# Patient Record
Sex: Male | Born: 1965
Health system: Southern US, Community
[De-identification: ages and names within clinical notes are randomized; demographics above are authoritative.]

## PROBLEM LIST (undated history)

## (undated) DIAGNOSIS — M1990 Unspecified osteoarthritis, unspecified site: Secondary | ICD-10-CM

## (undated) DIAGNOSIS — K219 Gastro-esophageal reflux disease without esophagitis: Secondary | ICD-10-CM

## (undated) HISTORY — PX: APPENDECTOMY: SHX54

## (undated) HISTORY — DX: Gastro-esophageal reflux disease without esophagitis: K21.9

## (undated) HISTORY — DX: Unspecified osteoarthritis, unspecified site: M19.90

---

## 2006-02-27 ENCOUNTER — Emergency Department (HOSPITAL_COMMUNITY): Admission: EM | Admit: 2006-02-27 | Discharge: 2006-02-27 | Payer: Self-pay | Admitting: Emergency Medicine

## 2006-10-02 ENCOUNTER — Emergency Department (HOSPITAL_COMMUNITY): Admission: EM | Admit: 2006-10-02 | Discharge: 2006-10-02 | Payer: Self-pay | Admitting: Family Medicine

## 2006-10-30 ENCOUNTER — Emergency Department (HOSPITAL_COMMUNITY): Admission: EM | Admit: 2006-10-30 | Discharge: 2006-10-30 | Payer: Self-pay | Admitting: Family Medicine

## 2007-03-22 ENCOUNTER — Emergency Department (HOSPITAL_COMMUNITY): Admission: EM | Admit: 2007-03-22 | Discharge: 2007-03-22 | Payer: Self-pay | Admitting: Emergency Medicine

## 2007-04-04 ENCOUNTER — Emergency Department (HOSPITAL_COMMUNITY): Admission: EM | Admit: 2007-04-04 | Discharge: 2007-04-04 | Payer: Self-pay | Admitting: Family Medicine

## 2007-04-06 ENCOUNTER — Emergency Department (HOSPITAL_COMMUNITY): Admission: EM | Admit: 2007-04-06 | Discharge: 2007-04-06 | Payer: Self-pay | Admitting: Emergency Medicine

## 2007-11-01 ENCOUNTER — Emergency Department (HOSPITAL_COMMUNITY): Admission: EM | Admit: 2007-11-01 | Discharge: 2007-11-01 | Payer: Self-pay | Admitting: Family Medicine

## 2007-11-03 ENCOUNTER — Emergency Department (HOSPITAL_COMMUNITY): Admission: EM | Admit: 2007-11-03 | Discharge: 2007-11-03 | Payer: Self-pay | Admitting: Family Medicine

## 2007-11-04 ENCOUNTER — Emergency Department (HOSPITAL_COMMUNITY): Admission: EM | Admit: 2007-11-04 | Discharge: 2007-11-04 | Payer: Self-pay | Admitting: Emergency Medicine

## 2008-05-13 ENCOUNTER — Emergency Department (HOSPITAL_COMMUNITY): Admission: EM | Admit: 2008-05-13 | Discharge: 2008-05-13 | Payer: Self-pay | Admitting: Emergency Medicine

## 2008-11-22 ENCOUNTER — Emergency Department (HOSPITAL_COMMUNITY): Admission: EM | Admit: 2008-11-22 | Discharge: 2008-11-22 | Payer: Self-pay | Admitting: Family Medicine

## 2009-05-05 ENCOUNTER — Emergency Department (HOSPITAL_COMMUNITY): Admission: EM | Admit: 2009-05-05 | Discharge: 2009-05-05 | Payer: Self-pay | Admitting: Emergency Medicine

## 2009-05-07 ENCOUNTER — Emergency Department (HOSPITAL_COMMUNITY): Admission: EM | Admit: 2009-05-07 | Discharge: 2009-05-07 | Payer: Self-pay | Admitting: Emergency Medicine

## 2009-07-21 ENCOUNTER — Emergency Department (HOSPITAL_COMMUNITY): Admission: EM | Admit: 2009-07-21 | Discharge: 2009-07-21 | Payer: Self-pay | Admitting: Emergency Medicine

## 2009-07-25 ENCOUNTER — Emergency Department (HOSPITAL_COMMUNITY): Admission: EM | Admit: 2009-07-25 | Discharge: 2009-07-25 | Payer: Self-pay | Admitting: Emergency Medicine

## 2009-10-31 ENCOUNTER — Ambulatory Visit: Payer: Self-pay | Admitting: Family Medicine

## 2009-10-31 DIAGNOSIS — K219 Gastro-esophageal reflux disease without esophagitis: Secondary | ICD-10-CM | POA: Insufficient documentation

## 2009-10-31 DIAGNOSIS — F17200 Nicotine dependence, unspecified, uncomplicated: Secondary | ICD-10-CM | POA: Insufficient documentation

## 2009-10-31 DIAGNOSIS — F329 Major depressive disorder, single episode, unspecified: Secondary | ICD-10-CM | POA: Insufficient documentation

## 2009-10-31 DIAGNOSIS — J309 Allergic rhinitis, unspecified: Secondary | ICD-10-CM | POA: Insufficient documentation

## 2009-10-31 LAB — CONVERTED CEMR LAB
Bilirubin Urine: NEGATIVE
Blood Glucose, Fingerstick: 126
Blood in Urine, dipstick: NEGATIVE
Glucose, Urine, Semiquant: NEGATIVE
Hgb A1c MFr Bld: 6 %
Ketones, urine, test strip: NEGATIVE
Nitrite: NEGATIVE
Protein, U semiquant: NEGATIVE
Specific Gravity, Urine: 1.02
Urobilinogen, UA: 0.2
WBC Urine, dipstick: NEGATIVE
pH: 6

## 2009-11-17 ENCOUNTER — Ambulatory Visit: Payer: Self-pay | Admitting: Family Medicine

## 2009-11-17 LAB — CONVERTED CEMR LAB
ALT: 114 units/L — ABNORMAL HIGH (ref 0–53)
AST: 62 units/L — ABNORMAL HIGH (ref 0–37)
Albumin: 4.4 g/dL (ref 3.5–5.2)
Alkaline Phosphatase: 68 units/L (ref 39–117)
BUN: 16 mg/dL (ref 6–23)
Basophils Absolute: 0.1 10*3/uL (ref 0.0–0.1)
Basophils Relative: 1 % (ref 0–1)
CO2: 24 meq/L (ref 19–32)
Calcium: 8.9 mg/dL (ref 8.4–10.5)
Chloride: 102 meq/L (ref 96–112)
Cholesterol: 158 mg/dL (ref 0–200)
Creatinine, Ser: 0.92 mg/dL (ref 0.40–1.50)
Eosinophils Absolute: 0.2 10*3/uL (ref 0.0–0.7)
Eosinophils Relative: 3 % (ref 0–5)
Glucose, Bld: 75 mg/dL (ref 70–99)
HCT: 47.2 % (ref 39.0–52.0)
HDL: 35 mg/dL — ABNORMAL LOW (ref >39)
Hemoglobin: 15.7 g/dL (ref 13.0–17.0)
LDL Cholesterol: 95 mg/dL (ref 0–99)
Lymphocytes Relative: 43 % (ref 12–46)
Lymphs Abs: 2.7 10*3/uL (ref 0.7–4.0)
MCHC: 33.3 g/dL (ref 30.0–36.0)
MCV: 90.2 fL (ref 78.0–100.0)
Monocytes Absolute: 0.6 10*3/uL (ref 0.1–1.0)
Monocytes Relative: 9 % (ref 3–12)
Neutro Abs: 2.8 10*3/uL (ref 1.7–7.7)
Neutrophils Relative %: 44 % (ref 43–77)
Platelets: 233 10*3/uL (ref 150–400)
Potassium: 4.6 meq/L (ref 3.5–5.3)
RBC: 5.23 M/uL (ref 4.22–5.81)
RDW: 14.7 % (ref 11.5–15.5)
Rapid HIV Screen: NEGATIVE
Sodium: 139 meq/L (ref 135–145)
Total Bilirubin: 0.5 mg/dL (ref 0.3–1.2)
Total CHOL/HDL Ratio: 4.5
Total Protein: 7.5 g/dL (ref 6.0–8.3)
Triglycerides: 138 mg/dL (ref <150)
VLDL: 28 mg/dL (ref 0–40)
Vit D, 25-Hydroxy: 13 ng/mL — ABNORMAL LOW (ref 30–89)
WBC: 6.4 10*3/uL (ref 4.0–10.5)

## 2009-11-22 ENCOUNTER — Telehealth (INDEPENDENT_AMBULATORY_CARE_PROVIDER_SITE_OTHER): Payer: Self-pay | Admitting: Family Medicine

## 2009-11-29 ENCOUNTER — Ambulatory Visit: Payer: Self-pay | Admitting: Family Medicine

## 2009-11-29 DIAGNOSIS — K769 Liver disease, unspecified: Secondary | ICD-10-CM | POA: Insufficient documentation

## 2009-11-29 LAB — CONVERTED CEMR LAB
ALT: 30 units/L (ref 0–53)
AST: 17 units/L (ref 0–37)
Albumin: 4.2 g/dL (ref 3.5–5.2)
Alkaline Phosphatase: 62 units/L (ref 39–117)
BUN: 11 mg/dL (ref 6–23)
CO2: 23 meq/L (ref 19–32)
Calcium: 9.2 mg/dL (ref 8.4–10.5)
Chloride: 104 meq/L (ref 96–112)
Creatinine, Ser: 0.76 mg/dL (ref 0.40–1.50)
Glucose, Bld: 81 mg/dL (ref 70–99)
HCT: 46.1 % (ref 39.0–52.0)
HCV Ab: NEGATIVE
Hemoglobin: 15.3 g/dL (ref 13.0–17.0)
Hep A IgM: NEGATIVE
Hep B C IgM: NEGATIVE
Hepatitis B Surface Ag: NEGATIVE
MCHC: 33.2 g/dL (ref 30.0–36.0)
MCV: 90.4 fL (ref 78.0–100.0)
Platelets: 219 10*3/uL (ref 150–400)
Potassium: 4.7 meq/L (ref 3.5–5.3)
RBC: 5.1 M/uL (ref 4.22–5.81)
RDW: 14.1 % (ref 11.5–15.5)
Sodium: 139 meq/L (ref 135–145)
Total Bilirubin: 0.5 mg/dL (ref 0.3–1.2)
Total Protein: 7.4 g/dL (ref 6.0–8.3)
WBC: 6.4 10*3/uL (ref 4.0–10.5)

## 2009-12-01 ENCOUNTER — Encounter (INDEPENDENT_AMBULATORY_CARE_PROVIDER_SITE_OTHER): Payer: Self-pay | Admitting: Family Medicine

## 2009-12-05 ENCOUNTER — Telehealth (INDEPENDENT_AMBULATORY_CARE_PROVIDER_SITE_OTHER): Payer: Self-pay | Admitting: Family Medicine

## 2009-12-05 ENCOUNTER — Encounter (INDEPENDENT_AMBULATORY_CARE_PROVIDER_SITE_OTHER): Payer: Self-pay | Admitting: Family Medicine

## 2009-12-13 ENCOUNTER — Encounter (INDEPENDENT_AMBULATORY_CARE_PROVIDER_SITE_OTHER): Payer: Self-pay | Admitting: Family Medicine

## 2009-12-13 ENCOUNTER — Ambulatory Visit: Payer: Self-pay | Admitting: Physician Assistant

## 2009-12-13 DIAGNOSIS — E559 Vitamin D deficiency, unspecified: Secondary | ICD-10-CM | POA: Insufficient documentation

## 2009-12-13 DIAGNOSIS — E739 Lactose intolerance, unspecified: Secondary | ICD-10-CM | POA: Insufficient documentation

## 2009-12-13 DIAGNOSIS — K029 Dental caries, unspecified: Secondary | ICD-10-CM | POA: Insufficient documentation

## 2009-12-13 DIAGNOSIS — M549 Dorsalgia, unspecified: Secondary | ICD-10-CM | POA: Insufficient documentation

## 2009-12-13 LAB — CONVERTED CEMR LAB
ALT: 39 units/L (ref 0–53)
AST: 19 units/L (ref 0–37)
Albumin: 4.4 g/dL (ref 3.5–5.2)
Alkaline Phosphatase: 68 units/L (ref 39–117)
BUN: 18 mg/dL (ref 6–23)
CO2: 23 meq/L (ref 19–32)
Calcium: 8.9 mg/dL (ref 8.4–10.5)
Chloride: 104 meq/L (ref 96–112)
Creatinine, Ser: 0.76 mg/dL (ref 0.40–1.50)
Glucose, Bld: 88 mg/dL (ref 70–99)
Potassium: 4.6 meq/L (ref 3.5–5.3)
Sodium: 138 meq/L (ref 135–145)
Total Bilirubin: 0.4 mg/dL (ref 0.3–1.2)
Total Protein: 7.2 g/dL (ref 6.0–8.3)

## 2009-12-16 ENCOUNTER — Ambulatory Visit (HOSPITAL_COMMUNITY): Admission: RE | Admit: 2009-12-16 | Discharge: 2009-12-16 | Payer: Self-pay | Admitting: Internal Medicine

## 2009-12-16 ENCOUNTER — Encounter: Payer: Self-pay | Admitting: Physician Assistant

## 2010-01-18 ENCOUNTER — Ambulatory Visit: Payer: Self-pay | Admitting: Physician Assistant

## 2010-06-22 ENCOUNTER — Encounter: Payer: Self-pay | Admitting: Physician Assistant

## 2010-08-09 ENCOUNTER — Ambulatory Visit: Payer: Self-pay | Admitting: Physician Assistant

## 2010-08-09 DIAGNOSIS — M25519 Pain in unspecified shoulder: Secondary | ICD-10-CM | POA: Insufficient documentation

## 2010-08-09 LAB — CONVERTED CEMR LAB: Blood Glucose, Fingerstick: 93

## 2010-08-10 LAB — CONVERTED CEMR LAB
Hgb A1c MFr Bld: 5.8 % — ABNORMAL HIGH (ref <5.7)
PSA: 0.21 ng/mL (ref 0.10–4.00)
Vit D, 25-Hydroxy: 14 ng/mL — ABNORMAL LOW (ref 30–89)

## 2010-10-19 ENCOUNTER — Emergency Department (HOSPITAL_COMMUNITY)
Admission: EM | Admit: 2010-10-19 | Discharge: 2010-10-19 | Payer: Self-pay | Source: Home / Self Care | Admitting: Emergency Medicine

## 2010-11-17 ENCOUNTER — Ambulatory Visit: Payer: Self-pay | Admitting: Nurse Practitioner

## 2010-12-31 ENCOUNTER — Encounter: Payer: Self-pay | Admitting: Family Medicine

## 2011-01-01 ENCOUNTER — Telehealth (INDEPENDENT_AMBULATORY_CARE_PROVIDER_SITE_OTHER): Payer: Self-pay | Admitting: Nurse Practitioner

## 2011-01-09 NOTE — Letter (Signed)
Summary: PT INFORMATION SHEET  PT INFORMATION SHEET   Imported By: Arta Bruce 12/21/2009 15:16:45  _____________________________________________________________________  External Attachment:    Type:   Image     Comment:   External Document

## 2011-01-09 NOTE — Progress Notes (Signed)
Summary: Office Visit//DEPRESSION SCREENING  Office Visit//DEPRESSION SCREENING   Imported By: Arta Bruce 08/11/2010 11:01:21  _____________________________________________________________________  External Attachment:    Type:   Image     Comment:   External Document
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
## 2011-01-09 NOTE — Letter (Signed)
Summary: DENTAL REFERRAL  DENTAL REFERRAL   Imported By: Arta Bruce 06/22/2010 09:54:17  _____________________________________________________________________  External Attachment:    Type:   Image     Comment:   External Document

## 2011-01-09 NOTE — Letter (Signed)
Summary: *HSN Results Follow up  [facility]  [address] [place], [place] [address]   Phone: [phone]  Fax: [phone]      12/16/2009   Preston Sullivan [address] [place], [place]  [address]   Dear  Mr. [name],                            ____S.Drinkard,FNP   ____D. Gore,FNP       ____B. McPherson,MD   ____V. Rankins,MD    ____E. Mulberry,MD    ____N. Daphine Deutscher, FNP  ____D. Reche Dixon, MD    ____K. Philipp Deputy, MD    __x__S. Alben Spittle, PA-C     This letter is to inform you that your recent test(s):  _______Pap Smear    ___x____Lab Test     ___x____X-ray    ___x____ is within acceptable limits  _______ requires a medication change  _______ requires a follow-up lab visit  _______ requires a follow-up visit with your provider   Comments: Ultrasound and labs were normal.  I am going to send you to physical therapy for your back.       _________________________________________________________ If you have any questions, please contact our office                     Sincerely,  Tereso Newcomer PA-C [facility]
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
## 2011-01-09 NOTE — Letter (Signed)
Summary: *HSN Results Follow up  [facility]  [address] [place], [place] [address]   Phone: [phone]  Fax: [phone]      12/01/2009   Preston Sullivan [address] [place], [place]  [address]   Dear  Mr. Preston Sullivan,                            ____S.Drinkard,FNP   ____D. Gore,FNP       ____B. McPherson,MD   ____V. Rankins,MD    ____E. Mulberry,MD    ____N. Daphine Deutscher, FNP  ____D. Reche Dixon, MD    ____K. Philipp Deputy, MD    ____Other     This letter is to inform you that your recent test(s):  _______Pap Smear    ____X___Lab Test     _______X-ray    ___X____ is within acceptable limits  _______ requires a medication change  _______ requires a follow-up lab visit  _______ requires a follow-up visit with your provider   Comments:        _________________________________________________________ If you have any questions, please contact our office                     Sincerely,  Syliva Overman MD [facility]

## 2011-01-09 NOTE — Assessment & Plan Note (Signed)
Summary: FU PER Makayla Confer//LABS/////KT   Vital Signs:  Patient profile:   45 year old male Height:      66.5 inches Weight:      193 pounds BMI:     30.80 Temp:     98.8 degrees F oral Pulse rate:   71 / minute Pulse rhythm:   regular Resp:     98.8 per minute BP sitting:   110 / 72  (left arm) Cuff size:   large  Vitals Entered By: [place] Shannon (November 29, 2009 9:08 AM) CC: f/u.... pt says he side is still hurting.... pt says that his left hand is real dry..... Is Patient Diabetic? No Pain Assessment Patient in pain? no       Does patient need assistance? Functional Status Self care Ambulation Normal   Visit Type:  F/u visit Primary Provider:  Carolyne Fiscal  CC:  f/u.... pt says he side is still hurting.... pt says that his left hand is real dry.....Marland Kitchen  History of Present Illness: Elevated liver enzymes, no known h/o hepatitis or known contact with hepatitis, also experiencing right sided pain that is somewhat constant and crampy in nature. Has been present months, did not have any prior episodes. >10 sexual partners in the past although he is currently monogamous with his wife, no known h/o STDs, no IV drug use or blood transfusions or old or "dirty" tattoos.  Smoking cessation. Down to 1/2 ppd from 1 ppd. Still trying to cut back, he states that he still needs to smoke because of all the stress that he is feeling with job situation.  Reflux. He did not fill his omeprazole, still experiencing occasional episodes of reflux although lessened in severity from his last visit. He stated that lines were too long at the [employer], I explained that he could get the medication at health department or OTC.  Depression. Unchanged, continues to feel down about his situation, but no SI/HI, psychotic features or stong impediment to his life. Discussed exercise again and again states no meds as preference.  Allergic rhinitis, feels like symptoms a little more prominent since flu  shot, did not fill medications, see above in reflux.  Reviewed lab results and discussed with the patient.  Current Medications (verified): 1)  Omeprazole 20 Mg Cpdr (Omeprazole) .Marland Kitchen.. 1 By Mouth Daily 2)  Flonase 50 Mcg/act Susp (Fluticasone Propionate) .... 2 Puffs Q Nostril Daily  Allergies (verified): No Known Drug Allergies PMH-FH-SH reviewed for relevance  Review of Systems General:  Complains of fatigue, malaise, and weakness; denies chills, fever, sweats, and weight loss. CV:  Denies chest pain or discomfort, fainting, near fainting, and palpitations. Resp:  Denies cough, coughing up blood, shortness of breath, and wheezing. GI:  Complains of abdominal pain and indigestion; denies bloody stools, dark tarry stools, diarrhea, loss of appetite, nausea, vomiting, vomiting blood, and yellowish skin color. GU:  Denies dysuria and hematuria.  Physical Exam  General:  Well-developed,well-nourished,in no acute distress; alert,appropriate and cooperative throughout examination Head:  Normocephalic and atraumatic without obvious abnormalities. No apparent alopecia or balding. Eyes:  No corneal or conjunctival inflammation noted. EOMI. Perrla. Funduscopic exam benign, without hemorrhages, exudates or papilledema. Vision decreased in L eye. Ears:  External ear exam shows no significant lesions or deformities.  Otoscopic examination reveals clear canals, tympanic membranes are intact bilaterally without bulging, retraction, inflammation or discharge. Hearing is grossly normal bilaterally. Nose:  External nasal examination shows no deformity or inflammation. Nasal mucosa are pink and moist without  lesions or exudates. Mouth:  Oral mucosa and oropharynx without lesions or exudates.  Teeth in good repair. Neck:  No deformities, masses, or tenderness noted. Lungs:  Normal respiratory effort, chest expands symmetrically. Lungs are clear to auscultation, no crackles or wheezes. Heart:  Normal rate  and regular rhythm. S1 and S2 normal without gallop, murmur, click, rub or other extra sounds. Abdomen:  Bowel sounds positive,abdomen soft and non-tender without masses,  hernias noted. Organomegaly not definite, but + murphy's sign RUQ and liver palpable on inspiration. Cervical Nodes:  No lymphadenopathy noted Psych:  Cognition and judgment appear intact. Alert and cooperative with normal attention span and concentration. No apparent delusions, illusions, hallucinations   Impression & Recommendations:  Problem # 1:  LIVER PAIN (ICD-573.9) Assessment New Elevated ALT/AST, no heavy ETOH use, no known hepatitis  Orders: Radiology Referral (Radiology)- hepatic US CMP CBC Hepatitis panel  Problem # 2:  PREVENTIVE HEALTH CARE (ICD-V70.0) Flu vaccine 10/2009 - "gave" him flu-like symptoms of malaise, fatigue, muscle pain, increased rhinorrhea. NEED to discuss pneumovax- Tobacco use Tetanus 2007 NEEDS HIV, RPR Vitamin D 13 -10/2009 LDL 95, HDL 35 -10/19/2009  Orders: T-Comprehensive Metabolic Panel ([id number]) T-Hepatitis Profile Acute (09811-91478) T-CBC w/Diff ([id number])  Problem # 3:  TOBACCO USER (ICD-305.1) Assessment: Improved Discussed cessation. He and wife have improved from 1ppd to 1/2 ppd. Currently continuing to cut back in anticipation of setting quit date. Reasons for use include decreases stress. We discussed nicotine withdrawal symptoms.  Orders: T-Comprehensive Metabolic Panel [phone]) T-Hepatitis Profile Acute (28413-24401) T-CBC w/Diff ([id number])  Problem # 4:  GERD (ICD-530.81) Assessment: Unchanged Did not fill omeprazole, will fill.  His updated medication list for this problem includes:    Omeprazole 20 Mg Cpdr (Omeprazole) .Marland Kitchen... 1 by mouth daily  Orders: T-Comprehensive Metabolic Panel [phone]) T-Hepatitis Profile Acute ([id number]) T-CBC w/Diff ([id number])  Problem # 5:  ALLERGIC RHINITIS (ICD-477.9) Assessment:  Unchanged Did not fill, will fill.  His updated medication list for this problem includes:    Flonase 50 Mcg/act Susp (Fluticasone propionate) .Marland Kitchen... 2 puffs q nostril daily  Orders: T-Comprehensive Metabolic Panel [phone]) T-Hepatitis Profile Acute (35573-22025) T-CBC w/Diff ([id number])  Problem # 6:  DEPRESSION (ICD-311) Assessment: Unchanged Feelings of being down, currently not on an exercise or diet plan.  No SI/HI or major impedence, perfers no medications. He is to begin exercise regimen and consider psychology in future as needed.  Orders: T-Comprehensive Metabolic Panel [id number]) T-Hepatitis Profile Acute ([id number]) T-CBC w/Diff ([id number])  Complete Medication List: 1)  Omeprazole 20 Mg Cpdr (Omeprazole) .Marland Kitchen.. 1 by mouth daily 2)  Flonase 50 Mcg/act Susp (Fluticasone propionate) .... 2 puffs q nostril daily 3)  Ergocalciferol 50000 Unit Caps (Ergocalciferol) .Marland Kitchen.. 1 by mouth weekly x 12 weeks  Patient Instructions: 1)  Please schedule a follow-up appointment in 2 weeks with provider here at [facility]. 2)  Tobacco is very bad for your health and your loved ones! You Should stop smoking!. 3)  Stop Smoking Tips: Choose a Quit date. Cut down before the Quit date. decide what you will do as a substitute when you feel the urge to smoke(gum,toothpick,exercise). 4)  It is important that you exercise regularly at least 20 minutes 5 times a week. If you develop chest pain, have severe difficulty breathing, or feel very tired , stop exercising immediately and seek medical attention. Prescriptions: ERGOCALCIFEROL 50000 UNIT CAPS (ERGOCALCIFEROL) 1 by mouth weekly x 12 weeks  #12 x 0   Entered and Authorized by:   Clayburn Pert  Carolyne Fiscal MD   Signed by:   Syliva Overman MD on 11/29/2009   Method used:   Print then Give to Patient   RxID:   [id number]

## 2011-01-09 NOTE — Assessment & Plan Note (Signed)
Summary: 2 MONTH FU///KT   Vital Signs:  Patient profile:   45 year old male Weight:      197.3 pounds Temp:     98.1 degrees F Pulse rate:   69 / minute Pulse rhythm:       regular Resp:     16 per minute BP sitting:   98 / 57  (right arm) Cuff size:   large  Vitals Entered By: Geanie Cooley  (January 18, 2010 11:44 AM) CC: PT here for 84month followup. Pt would like to know the results of his recent ultrasound. PT states he has occasional back pain. FYI pt hasnt heard anything on his physical therapy, refaxing referral., Abdominal Pain Is Patient Diabetic? No Pain Assessment Patient in pain? no       Does patient need assistance? Functional Status Self care Ambulation Normal   Primary Care Provider:  Tereso Newcomer, PA-C  CC:  PT here for 84month followup. Pt would like to know the results of his recent ultrasound. PT states he has occasional back pain. FYI pt hasnt heard anything on his physical therapy, refaxing referral., and Abdominal Pain.  History of Present Illness: Here for f/u. Never got appt for PT. Never got meds for back.  Pharmacy thought his card was expired.  Has tried heat with relief.  No radicular symptoms.  Only feels pain with certain movements.  No change in his symptoms since his last visit. Has infrequent GERD symptoms.  Takes OTC zantac with relief.  Only notes with certain types of foods.  Dyspepsia History:      He has no alarm features of dyspepsia including no history of melena, hematochezia, dysphagia, persistent vomiting, or involuntary weight loss > 5%.  There is a prior history of GERD.  An H-2 blocker medication is currently being taken.     Allergies (verified): No Known Drug Allergies  Physical Exam  General:  alert, well-developed, and well-nourished.   Head:  normocephalic and atraumatic.   Lungs:  normal breath sounds, no crackles, and no wheezes.   Heart:  normal rate and regular rhythm.   Abdomen:  soft, non-tender, normal  bowel sounds, and no hepatomegaly.   Msk:  no spinal tend with palp neg SLR bilat some muscle tightness noted in paraspinal muscles on right lumbar are  Neurologic:  alert & oriented X3 and cranial nerves II-XII intact.     Impression & Recommendations:  Problem # 1:  BACK PAIN (ICD-724.5) never got referral to PT also never got meds from pharm called [facility] to inquire about his meds . . . he can pick up tomorrow  His updated medication list for this problem includes:    Naproxen 500 Mg Tabs (Naproxen) .Marland Kitchen... Take 1 tablet by mouth two times a day with food x 3 days, then take two times a day with food as needed for pain    Robaxin 500 Mg Tabs (Methocarbamol) .Marland Kitchen... 1 by mouth every 6 hours as needed for back spasm  Problem # 2:  GERD (ICD-530.81) actually quite mild and controlled with OTC meds will change to pepcid and he can take as needed  His updated medication list for this problem includes:    Pepcid 20 Mg Tabs (Famotidine) .Marland Kitchen... Take 1 tablet by mouth two times a day as needed for indigestion  Problem # 3:  PREVENTIVE HEALTH CARE (ICD-V70.0) schedule cpe  Complete Medication List: 1)  Pepcid 20 Mg Tabs (Famotidine) .... Take 1 tablet by mouth  two times a day as needed for indigestion 2)  Flonase 50 Mcg/act Susp (Fluticasone propionate) .... 2 puffs q nostril daily 3)  Ergocalciferol 50000 Unit Caps (Ergocalciferol) .Marland Kitchen.. 1 by mouth weekly x 12 weeks 4)  Naproxen 500 Mg Tabs (Naproxen) .... Take 1 tablet by mouth two times a day with food x 3 days, then take two times a day with food as needed for pain 5)  Robaxin 500 Mg Tabs (Methocarbamol) .Marland Kitchen.. 1 by mouth every 6 hours as needed for back spasm  Patient Instructions: 1)  Please schedule a follow-up appointment in 4 months with Scott for CPE.  Prescriptions: PEPCID 20 MG TABS (FAMOTIDINE) Take 1 tablet by mouth two times a day as needed for indigestion  #60 x 3   Entered and Authorized by:   Tereso Newcomer PA-C   Signed  by:   Tereso Newcomer PA-C on 01/18/2010   Method used:   Faxed to ...       [facility] (retail)       [address] [place], [place]  [address]       Ph: [phone]       Fax: [phone]   RxID:   [id number]    Vital Signs:  Patient profile:   45 year old male Weight:      197.3 pounds Temp:     98.1 degrees F Pulse rate:   69 / minute Pulse rhythm:       regular Resp:     16 per minute BP sitting:   98 / 57  (right arm) Cuff size:   large  Vitals Entered By: Geanie Cooley  (January 18, 2010 11:44 AM)
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
## 2011-01-09 NOTE — Progress Notes (Signed)
Summary: NO SHOW  Phone Note From Other Clinic   Summary of Call: received call from (radiology)Preston Sullivan.  Pt no showed on 12/23 for U/S abdomen. Initial call taken by: Preston Sullivan,  December 05, 2009 2:24 PM  Follow-up for Phone Call        Letter sent to patient discussing this requesting him to call our office to let us know what the problem was. If patient agrees, please reschedule as pt with unexplained liver pain and possible mild hepatomegaly.  Follow-up by: Preston Overman MD,  December 05, 2009 5:22 PM  Additional Follow-up for Phone Call Additional follow up Details #1::        Preston Sullivan CALLED AND SAYS THAT HE GOT THE LETTER ABOUT MISSING HIS APPT. HE SAYS THAT HE DIDN'T KNOW THAT HE HAD THIS APPT. AND WAS TOLD THAT SOMEONE HERE WAS GOING TO LET HIM KNOW WHEN THE APPT WAS MADE, PLUS HIS PHONE WAS OFF AND IT WAS JUST TURNED BACK ON. HE WOULD LIKE FOR [place] TO RESCHEDULE IT AND LET HIM KNOW. Additional Follow-up by: Preston Sullivan,  December 08, 2009 3:32 PM    Additional Follow-up for Phone Call Additional follow up Details #2::    will print out referral again and have Preston Sullivan complete and contact pt with appt. phone note completed... Follow-up by: Preston Sullivan CMA,  December 08, 2009 3:52 PM

## 2011-01-09 NOTE — Progress Notes (Signed)
Summary: Office Visit/DEPRESSION SCREENING  Office Visit/DEPRESSION SCREENING   Imported By: Arta Bruce 12/21/2009 15:02:30  _____________________________________________________________________  External Attachment:    Type:   Image     Comment:   External Document

## 2011-01-09 NOTE — Letter (Signed)
Summary: DENTAL REFERRAL  DENTAL REFERRAL   Imported By: Arta Bruce 01/27/2010 12:31:17  _____________________________________________________________________  External Attachment:    Type:   Image     Comment:   External Document

## 2011-01-09 NOTE — Letter (Signed)
Summary: Generic Letter  [facility]  [address] [place], [place] [address]   Phone: [phone]  Fax: [phone]    12/05/2009  QUILLAN WHITTER [address] [place], [place]  [address]  Dear Mr. Montez Morita,  Mr Hartland, the Radiology Department has recently informed us that you did not show up to get your ultrasound completed. Please let us know what the issues are regarding this so that we can make appropriate future arrangements.  Sincerely,   Syliva Overman MD

## 2011-01-09 NOTE — Letter (Signed)
Summary: SLIDING SCALE FEE  SLIDING SCALE FEE   Imported By: Arta Bruce 12/21/2009 15:17:55  _____________________________________________________________________  External Attachment:    Type:   Image     Comment:   External Document

## 2011-01-09 NOTE — Progress Notes (Signed)
Summary: appt. needed  Phone Note Outgoing Call   Call placed by: Syliva Overman MD,  November 22, 2009 1:08 PM Action Taken: Phone Call Completed Summary of Call: Called and spoke with patient regarding his elevated LFTs, he continues to have right sided back pain and abdominal pain without much help from PPI. Please schedule him to see me next week as able, in addition, please schedule him to come in for CMP, lipase, h. pylori antigen, hepatitis panel so that results will be available at his visit.  Thank you, Syliva Overman  Follow-up for Phone Call        Selena Batten, please schedule Follow-up by: Vesta Mixer CMA,  November 22, 2009 2:18 PM  Additional Follow-up for Phone Call Additional follow up Details #1::        PATIENT IS SCHED FOR APPTS. Additional Follow-up by: Leodis Rains,  November 23, 2009 10:34 AM

## 2011-01-11 NOTE — Progress Notes (Signed)
Summary: Query:  Refill diclofenac?  Phone Note Refill Request   Refills Requested: Medication #1:  DICLOFENAC SODIUM 75 MG TBEC Take 1 tablet by mouth two times a day with food as needed for pain PT NEED A REFILL - [facility] [place]   PLEASE,CALL PT [phone]  THANK YOU   Initial call taken by: Cheryll Dessert,  January 01, 2011 9:35 AM  Follow-up for Phone Call        Refill per protocol or x1?  Last seen 07/2010 for CPE.  Dutch Quint RN  January 01, 2011 9:41 AM   Additional Follow-up for Phone Call Additional follow up Details #1::        yes, ok to refill Additional Follow-up by: Lehman Prom FNP,  January 01, 2011 9:44 AM    Prescriptions: DICLOFENAC SODIUM 75 MG TBEC (DICLOFENAC SODIUM) Take 1 tablet by mouth two times a day with food as needed for pain  #60 x 1   Entered by:   Dutch Quint RN   Authorized by:   Lehman Prom FNP   Signed by:   Dutch Quint RN on 01/01/2011   Method used:   Electronically to        [facility] (retail)       [address]       [place], [place]  [address]       Ph: [phone]       Fax: [phone]   RxID:   [id number]

## 2011-03-20 LAB — POCT URINALYSIS DIP (DEVICE)
Glucose, UA: NEGATIVE mg/dL
Hgb urine dipstick: NEGATIVE
Ketones, ur: NEGATIVE mg/dL
Nitrite: NEGATIVE
Protein, ur: NEGATIVE mg/dL
Specific Gravity, Urine: >=1.03 (ref 1.005–1.030)
Urobilinogen, UA: 0.2 mg/dL (ref 0.0–1.0)
pH: 6 (ref 5.0–8.0)
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
## 2011-05-16 ENCOUNTER — Inpatient Hospital Stay (INDEPENDENT_AMBULATORY_CARE_PROVIDER_SITE_OTHER)
Admission: RE | Admit: 2011-05-16 | Discharge: 2011-05-16 | Disposition: A | Discharge status: Home / Self Care | Payer: Self-pay | Source: Ambulatory Visit | Attending: Emergency Medicine | Admitting: Emergency Medicine

## 2011-05-16 ENCOUNTER — Ambulatory Visit (INDEPENDENT_AMBULATORY_CARE_PROVIDER_SITE_OTHER): Payer: Self-pay

## 2011-05-16 DIAGNOSIS — M25519 Pain in unspecified shoulder: Secondary | ICD-10-CM

## 2011-09-06 LAB — POCT I-STAT, CHEM 8
BUN: 15
Calcium, Ion: 1.18
Chloride: 103
Creatinine, Ser: 0.8
Glucose, Bld: 99
HCT: 46
Hemoglobin: 15.6
Potassium: 3.7
Sodium: 138
TCO2: 26

## 2011-09-06 LAB — HEPATIC FUNCTION PANEL
ALT: 33
AST: 25
Albumin: 3.7
Alkaline Phosphatase: 70
Bilirubin, Direct: 0.1
Indirect Bilirubin: 0.5
Total Bilirubin: 0.6
Total Protein: 7

## 2011-09-06 LAB — CBC
HCT: 44.2
Hemoglobin: 15.1
MCHC: 34.1
MCV: 88.5
Platelets: 216
RBC: 4.99
RDW: 14.2
WBC: 6.6

## 2011-09-06 LAB — DIFFERENTIAL
Basophils Absolute: 0.1
Basophils Relative: 1
Eosinophils Absolute: 0.2
Eosinophils Relative: 3
Lymphocytes Relative: 36
Lymphs Abs: 2.4
Monocytes Absolute: 0.4
Monocytes Relative: 6
Neutro Abs: 3.5
Neutrophils Relative %: 54

## 2011-09-06 LAB — LIPASE, BLOOD: Lipase: 15

## 2011-09-14 LAB — POCT URINALYSIS DIP (DEVICE)
Bilirubin Urine: NEGATIVE
Glucose, UA: NEGATIVE mg/dL
Hgb urine dipstick: NEGATIVE
Ketones, ur: NEGATIVE mg/dL
Nitrite: NEGATIVE
Protein, ur: NEGATIVE mg/dL
Specific Gravity, Urine: 1.02 (ref 1.005–1.030)
Urobilinogen, UA: 0.2 mg/dL (ref 0.0–1.0)
pH: 5.5 (ref 5.0–8.0)

## 2011-09-18 LAB — CBC
HCT: 42.7
Hemoglobin: 14.6
MCHC: 34.3
MCV: 87.8
Platelets: 230
RBC: 4.86
RDW: 13.8
WBC: 7.8

## 2011-09-18 LAB — DIFFERENTIAL
Basophils Absolute: 0
Basophils Relative: 1
Eosinophils Absolute: 0.1 — ABNORMAL LOW
Eosinophils Relative: 1
Lymphocytes Relative: 35
Lymphs Abs: 2.7
Monocytes Absolute: 0.6
Monocytes Relative: 7
Neutro Abs: 4.4
Neutrophils Relative %: 56

## 2011-09-18 LAB — WOUND CULTURE: Culture: NO GROWTH

## 2011-09-18 LAB — CULTURE, ROUTINE-ABSCESS

## 2011-10-24 ENCOUNTER — Encounter: Payer: Self-pay | Admitting: Emergency Medicine

## 2011-10-24 ENCOUNTER — Emergency Department (HOSPITAL_COMMUNITY)
Admission: EM | Admit: 2011-10-24 | Discharge: 2011-10-24 | Disposition: A | Discharge status: Home / Self Care | Payer: Self-pay | Attending: Emergency Medicine | Admitting: Emergency Medicine

## 2011-10-24 DIAGNOSIS — Z9889 Other specified postprocedural states: Secondary | ICD-10-CM | POA: Insufficient documentation

## 2011-10-24 DIAGNOSIS — K089 Disorder of teeth and supporting structures, unspecified: Secondary | ICD-10-CM | POA: Insufficient documentation

## 2011-10-24 DIAGNOSIS — F17200 Nicotine dependence, unspecified, uncomplicated: Secondary | ICD-10-CM | POA: Insufficient documentation

## 2011-10-24 DIAGNOSIS — K029 Dental caries, unspecified: Secondary | ICD-10-CM | POA: Insufficient documentation

## 2011-10-24 MED ORDER — HYDROCODONE-ACETAMINOPHEN 5-325 MG PO TABS: 2.0000 | ORAL_TABLET | ORAL | Status: AC | PRN

## 2011-10-24 MED ORDER — PENICILLIN V POTASSIUM 500 MG PO TABS: 500.0000 mg | ORAL_TABLET | Freq: Four times a day (QID) | ORAL | Status: DC

## 2011-10-24 NOTE — ED Provider Notes (Signed)
History     CSN: [id number] Arrival date & time: 10/24/2011  7:01 AM   First MD Initiated Contact with Patient 10/24/11 [phone]      Chief Complaint  Patient presents with  . Dental Pain    (Consider location/radiation/quality/duration/timing/severity/associated sxs/prior treatment) HPI Patient presents with chief complaint of toothache.  Pain is been present for several days.  Patient denies fever, chills.  Patient denies nausea vomiting.  Patient is going to see a dentist tomorrow.  Past history is as noted.  Patient denies airway or swallowing problems.  Pain is been constant.  Pain is not been relieved with over-the-counter medicines. History reviewed. No pertinent past medical history.  Past Surgical History  Procedure Date  . Appendectomy     History reviewed. No pertinent family history.  History  Substance Use Topics  . Smoking status: Current Everyday Smoker    Types: Cigarettes  . Smokeless tobacco: Not on file  . Alcohol Use: No      Review of Systems  All other systems reviewed and are negative.    Allergies  Review of patient's allergies indicates no known allergies.  Home Medications  No current outpatient prescriptions on file.  BP 118/69  Pulse 68  Temp(Src) 99 F (37.2 C) (Oral)  Resp 22  SpO2 100%  Physical Exam  Nursing note and vitals reviewed. Constitutional: He is oriented to person, place, and time. He appears well-developed and well-nourished. No distress.  HENT:  Head: Normocephalic and atraumatic.  Mouth/Throat: Dental caries present. No dental abscesses or uvula swelling.    Eyes: Pupils are equal, round, and reactive to light.  Neck: Normal range of motion.  Cardiovascular: Normal rate and intact distal pulses.   Pulmonary/Chest: No respiratory distress.  Abdominal: Normal appearance. He exhibits no distension.  Musculoskeletal: Normal range of motion.  Neurological: He is alert and oriented to person, place, and time. No  cranial nerve deficit.  Skin: Skin is warm and dry. No rash noted.  Psychiatric: He has a normal mood and affect. His behavior is normal.    ED Course  Procedures (including critical care time)  Labs Reviewed - No data to display No results found.   No diagnosis found.    MDM          Nelia Shi, MD 10/24/11 [phone]

## 2011-10-24 NOTE — ED Notes (Signed)
C/o left upper tooth pain (10/10) slight swelling to left face. Pt. Reports that he plans on going to the dental clinic tomorrow, but is in severe pain.

## 2011-10-24 NOTE — ED Notes (Signed)
Pt is c/o toothache on the top left side

## 2012-01-09 ENCOUNTER — Encounter (HOSPITAL_COMMUNITY): Payer: Self-pay | Admitting: *Deleted

## 2012-01-09 ENCOUNTER — Emergency Department (HOSPITAL_COMMUNITY)
Admission: EM | Admit: 2012-01-09 | Discharge: 2012-01-09 | Disposition: A | Discharge status: Home / Self Care | Payer: Self-pay | Attending: Emergency Medicine | Admitting: Emergency Medicine

## 2012-01-09 DIAGNOSIS — H9209 Otalgia, unspecified ear: Secondary | ICD-10-CM | POA: Insufficient documentation

## 2012-01-09 DIAGNOSIS — H9190 Unspecified hearing loss, unspecified ear: Secondary | ICD-10-CM | POA: Insufficient documentation

## 2012-01-09 DIAGNOSIS — J3489 Other specified disorders of nose and nasal sinuses: Secondary | ICD-10-CM | POA: Insufficient documentation

## 2012-01-09 DIAGNOSIS — R0982 Postnasal drip: Secondary | ICD-10-CM | POA: Insufficient documentation

## 2012-01-09 DIAGNOSIS — H6691 Otitis media, unspecified, right ear: Secondary | ICD-10-CM

## 2012-01-09 DIAGNOSIS — R599 Enlarged lymph nodes, unspecified: Secondary | ICD-10-CM | POA: Insufficient documentation

## 2012-01-09 DIAGNOSIS — H6690 Otitis media, unspecified, unspecified ear: Secondary | ICD-10-CM | POA: Insufficient documentation

## 2012-01-09 MED ORDER — AMOXICILLIN 500 MG PO CAPS: 500.0000 mg | ORAL_CAPSULE | Freq: Three times a day (TID) | ORAL | Status: DC

## 2012-01-09 MED ORDER — CETIRIZINE-PSEUDOEPHEDRINE ER 5-120 MG PO TB12: 1.0000 | ORAL_TABLET | Freq: Every day | ORAL | Status: AC

## 2012-01-09 NOTE — ED Notes (Signed)
Pt states that he had a head cold last week. Pt states that he had a productive cough with yellow/green mucous. States that he has had hearing loss for about four days on his right ear and is starting to move to left ear as well. States that he has tried ear drops with no relief. States that he is also having tingling in his face as well since the hearing loss has started.

## 2012-01-09 NOTE — ED Provider Notes (Signed)
History     CSN: [id number]  Arrival date & time 01/09/12  4098   First MD Initiated Contact with Patient 01/09/12 [phone]      Chief Complaint  Patient presents with  . Foreign Body in Ear    (Consider location/radiation/quality/duration/timing/severity/associated sxs/prior treatment) Patient is a 46 y.o. male presenting with ear pain. The history is provided by the patient.  Otalgia This is a new problem. The current episode started 2 days ago. There is pain in the right ear. The problem occurs constantly. The problem has been gradually worsening. Pertinent negatives include no ear discharge, no sore throat and no neck pain.  Pt reports URI symptoms for the last week. States all getting better, but now his right ear is hurting and he is loosing hearing in it. Denies fever, chills, malaise. No drainage from the ear. No injury to the ear.  History reviewed. No pertinent past medical history.  Past Surgical History  Procedure Date  . Appendectomy     History reviewed. No pertinent family history.  History  Substance Use Topics  . Smoking status: Current Everyday Smoker -- 0.5 packs/day    Types: Cigarettes  . Smokeless tobacco: Not on file  . Alcohol Use: No      Review of Systems  Constitutional: Negative for fever and chills.  HENT: Positive for ear pain, congestion and postnasal drip. Negative for sore throat, neck pain and ear discharge.   Eyes: Negative.   Respiratory: Negative.   Cardiovascular: Negative.   Gastrointestinal: Negative.   Genitourinary: Negative.   Musculoskeletal: Negative.   Skin: Negative.   Neurological: Negative.   Psychiatric/Behavioral: Negative.     Allergies  Review of patient's allergies indicates no known allergies.  Home Medications   Current Outpatient Rx  Name Route Sig Dispense Refill  . CARBAMIDE PEROXIDE 6.5 % OT SOLN Right Ear Place 5 drops into the right ear once. Ear pain    . PENICILLIN V POTASSIUM 500 MG PO TABS Oral  Take 1 tablet (500 mg total) by mouth 4 (four) times daily. 40 tablet 0    BP 158/91  Pulse 82  Temp(Src) 98.4 F (36.9 C) (Oral)  Resp 18  SpO2 99%  Physical Exam  Nursing note and vitals reviewed. Constitutional: He is oriented to person, place, and time. He appears well-developed and well-nourished. No distress.  HENT:  Head: Normocephalic and atraumatic.  Right Ear: External ear and ear canal normal. Tympanic membrane is injected and erythematous. A middle ear effusion is present.  Left Ear: External ear and ear canal normal.  Nose: Rhinorrhea present.  Mouth/Throat: Uvula is midline, oropharynx is clear and moist and mucous membranes are normal.  Eyes: Conjunctivae are normal.  Neck: Normal range of motion. Neck supple.       Right submandibular lymphadenopathy, tender.  Cardiovascular: Normal rate, regular rhythm and normal heart sounds.   Pulmonary/Chest: Effort normal and breath sounds normal. No respiratory distress.  Musculoskeletal: Normal range of motion. He exhibits no edema.  Neurological: He is alert and oriented to person, place, and time.  Skin: Skin is warm and dry. No erythema.  Psychiatric: He has a normal mood and affect.    ED Course  Procedures (including critical care time)  Otitis Media on exam with effusion. Will treat with decongestans and antibiotic. WIll d/ chome.   No diagnosis found.    MDM          Lottie Mussel, PA 01/09/12 [phone]

## 2012-01-09 NOTE — ED Provider Notes (Signed)
Medical screening examination/treatment/procedure(s) were performed by non-physician practitioner and as supervising physician I was immediately available for consultation/collaboration.   Dayton Bailiff, MD 01/09/12 [phone]

## 2012-01-16 ENCOUNTER — Encounter (HOSPITAL_COMMUNITY): Payer: Self-pay | Admitting: *Deleted

## 2012-01-16 ENCOUNTER — Emergency Department (INDEPENDENT_AMBULATORY_CARE_PROVIDER_SITE_OTHER)
Admission: EM | Admit: 2012-01-16 | Discharge: 2012-01-16 | Disposition: A | Discharge status: Home / Self Care | Payer: Self-pay | Source: Home / Self Care | Attending: Family Medicine | Admitting: Family Medicine

## 2012-01-16 DIAGNOSIS — H6980 Other specified disorders of Eustachian tube, unspecified ear: Secondary | ICD-10-CM

## 2012-01-16 MED ORDER — FLUTICASONE PROPIONATE 50 MCG/ACT NA SUSP: 2.0000 | Freq: Every day | NASAL | Status: DC

## 2012-01-16 NOTE — ED Notes (Signed)
Pt states he was seen in the ED 7 days ago for right ear pain and not hearing well.  Was put on Amoxicillin and Claritin D.  Has finished antibiotic, but still cannot hear due to the pressure.  Now in both ears and still having some pain.

## 2012-01-16 NOTE — ED Provider Notes (Signed)
History     CSN: [id number]  Arrival date & time 01/16/12  0806   First MD Initiated Contact with Patient 01/16/12 [phone]      Chief Complaint  Patient presents with  . Otalgia    (Consider location/radiation/quality/duration/timing/severity/associated sxs/prior treatment) HPI Comments: Preston Sullivan presents for evaluation of bilateral ear pain, ear fullness, and decreased hearing. He reports he was seen in emergency department last week diagnosed with an ear infection and given antibiotics. He was also given and a decongestant and antihistamine. He was advised to use normal saline nasal sprays. He reports some mild improvement in the right ear but now has bilateral fullness and decreased hearing. He works in a high noise environment and was sent home and told to seek medical care. He denies any fever or other symptoms. He denies any history of seasonal allergies.  Patient is a 46 y.o. male presenting with ear pain. The history is provided by the patient.  Otalgia This is a new problem. The current episode started more than 1 week ago. There is pain in both ears. The problem occurs constantly. The problem has not changed since onset.There has been no fever. The patient is experiencing no pain. Associated symptoms include hearing loss. Pertinent negatives include no rhinorrhea.    History reviewed. No pertinent past medical history.  Past Surgical History  Procedure Date  . Appendectomy     No family history on file.  History  Substance Use Topics  . Smoking status: Current Everyday Smoker -- 0.5 packs/day    Types: Cigarettes  . Smokeless tobacco: Not on file  . Alcohol Use: No      Review of Systems  Constitutional: Negative.   HENT: Positive for hearing loss, ear pain and congestion. Negative for rhinorrhea.   Eyes: Negative.   Respiratory: Negative.   Cardiovascular: Negative.   Gastrointestinal: Negative.   Genitourinary: Negative.   Musculoskeletal: Negative.   Skin:  Negative.   Neurological: Negative.     Allergies  Review of patient's allergies indicates no known allergies.  Home Medications   Current Outpatient Rx  Name Route Sig Dispense Refill  . CETIRIZINE-PSEUDOEPHEDRINE ER 5-120 MG PO TB12 Oral Take 1 tablet by mouth daily. 30 tablet 0  . CARBAMIDE PEROXIDE 6.5 % OT SOLN Right Ear Place 5 drops into the right ear once. Ear pain    . FLUTICASONE PROPIONATE 50 MCG/ACT NA SUSP Nasal Place 2 sprays into the nose daily. 16 g 2    BP 124/83  Pulse 88  Temp(Src) 98.3 F (36.8 C) (Oral)  Resp 16  SpO2 98%  Physical Exam  Nursing note and vitals reviewed. Constitutional: He is oriented to person, place, and time. He appears well-developed and well-nourished.  HENT:  Head: Normocephalic and atraumatic.  Right Ear: Tympanic membrane is retracted. Tympanic membrane is not erythematous.  Left Ear: Tympanic membrane is retracted. Tympanic membrane is not erythematous.  Mouth/Throat: Uvula is midline, oropharynx is clear and moist and mucous membranes are normal.  Eyes: EOM are normal.  Neck: Normal range of motion.  Pulmonary/Chest: Effort normal.  Musculoskeletal: Normal range of motion.  Neurological: He is alert and oriented to person, place, and time.  Skin: Skin is warm and dry.  Psychiatric: His behavior is normal.    ED Course  Procedures (including critical care time)  Labs Reviewed - No data to display No results found.   1. Eustachian tube dysfunction       MDM  rx given for fluticasone PRN  Richardo Priest, MD 01/16/12 [phone]

## 2013-10-11 ENCOUNTER — Emergency Department (HOSPITAL_COMMUNITY)
Admission: EM | Admit: 2013-10-11 | Discharge: 2013-10-11 | Disposition: A | Discharge status: Home / Self Care | Payer: Self-pay | Attending: Emergency Medicine | Admitting: Emergency Medicine

## 2013-10-11 ENCOUNTER — Encounter (HOSPITAL_COMMUNITY): Payer: Self-pay | Admitting: Emergency Medicine

## 2013-10-11 ENCOUNTER — Emergency Department (HOSPITAL_COMMUNITY): Payer: Self-pay

## 2013-10-11 DIAGNOSIS — F17200 Nicotine dependence, unspecified, uncomplicated: Secondary | ICD-10-CM

## 2013-10-11 DIAGNOSIS — J029 Acute pharyngitis, unspecified: Secondary | ICD-10-CM

## 2013-10-11 DIAGNOSIS — Z79899 Other long term (current) drug therapy: Secondary | ICD-10-CM | POA: Insufficient documentation

## 2013-10-11 DIAGNOSIS — J069 Acute upper respiratory infection, unspecified: Secondary | ICD-10-CM

## 2013-10-11 DIAGNOSIS — R52 Pain, unspecified: Secondary | ICD-10-CM | POA: Insufficient documentation

## 2013-10-11 DIAGNOSIS — R498 Other voice and resonance disorders: Secondary | ICD-10-CM | POA: Insufficient documentation

## 2013-10-11 LAB — RAPID STREP SCREEN (MED CTR MEBANE ONLY): Streptococcus, Group A Screen (Direct): NEGATIVE

## 2013-10-11 MED ORDER — PSEUDOEPHEDRINE HCL 60 MG PO TABS: 60.0000 mg | ORAL_TABLET | Freq: Four times a day (QID) | ORAL | Status: DC | PRN

## 2013-10-11 MED ORDER — AZITHROMYCIN 250 MG PO TABS: 250.0000 mg | ORAL_TABLET | Freq: Every day | ORAL | Status: DC

## 2013-10-11 NOTE — ED Notes (Signed)
Pt escorted to discharge window. Pt verbalized understanding discharge instructions. In no acute distress.  

## 2013-10-11 NOTE — ED Notes (Signed)
Pt alert and oriented x4. Respirations even and unlabored, bilateral symmetrical rise and fall of chest. Skin warm and dry. In no acute distress. Denies needs.   

## 2013-10-11 NOTE — ED Notes (Signed)
Pt from home reports sore throat, productive cough with yellow sputum, and body aches x1.5 weeks. Pt has taken OTC meds with no relief. Pt deneis N/V/D. Pt is A&O and in NAD

## 2013-10-11 NOTE — ED Provider Notes (Signed)
CSN: [id number]     Arrival date & time 10/11/13  8295 History   First MD Initiated Contact with Patient 10/11/13 0830     Chief Complaint  Patient presents with  . Sore Throat  . Cough  . Generalized Body Aches   (Consider location/radiation/quality/duration/timing/severity/associated sxs/prior Treatment) HPI Pt is a 47yo male c/o 1-2 week hx of sore throat associated with chest congestion, productive cough, and generalized body aches. He has been taking OTC medications including Mucinex and cough drops with no relieve. 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 Denies wearing a face mask while at work.  Pt is a 0.5-1ppd smoker. Denies fever, n/v/d. Denies hx of asthma. Denies chest pain.  History reviewed. No pertinent past medical history. Past Surgical History  Procedure Laterality Date  . Appendectomy     No family history on file. History  Substance Use Topics  . Smoking status: Current Every Day Smoker -- 0.50 packs/day    Types: Cigarettes  . Smokeless tobacco: Not on file  . Alcohol Use: No    Review of Systems  Constitutional: Negative for fever and chills.  HENT: Positive for congestion, sore throat and voice change. Negative for trouble swallowing.   Respiratory: Positive for cough. Negative for shortness of breath, wheezing and stridor.   Cardiovascular: Negative for chest pain.  Gastrointestinal: Negative for nausea, vomiting, abdominal pain and diarrhea.  Neurological: Negative for headaches.  All other systems reviewed and are negative.    Allergies  Review of patient's allergies indicates no known allergies.  Home Medications   Current Outpatient Rx  Name  Route  Sig  Dispense  Refill  . dextromethorphan-guaiFENesin (MUCINEX DM) 30-600 MG per 12 hr tablet   Oral   Take 1 tablet by mouth every 12 (twelve) hours.         Marland Kitchen azithromycin (ZITHROMAX) 250 MG tablet   Oral   Take 1 tablet (250 mg total) by mouth daily. Take first 2  tablets together, then 1 every day until finished.   6 tablet   0   . pseudoephedrine (SUDAFED) 60 MG tablet   Oral   Take 1 tablet (60 mg total) by mouth every 6 (six) hours as needed for congestion.   15 tablet   0    BP 127/78  Pulse 80  Temp(Src) 98.4 F (36.9 C) (Oral)  Resp 20  SpO2 98% Physical Exam  Nursing note and vitals reviewed. Constitutional: He appears well-developed and well-nourished.  HENT:  Head: Normocephalic and atraumatic.  Right Ear: Hearing, tympanic membrane, external ear and ear canal normal.  Left Ear: Hearing, tympanic membrane, external ear and ear canal normal.  Nose: Mucosal edema present.  Mouth/Throat: Uvula is midline and mucous membranes are normal. Posterior oropharyngeal erythema present. No oropharyngeal exudate, posterior oropharyngeal edema or tonsillar abscesses.  Eyes: Conjunctivae are normal. No scleral icterus.  Neck: Normal range of motion.  Cardiovascular: Normal rate, regular rhythm and normal heart sounds.   Pulmonary/Chest: Effort normal. No respiratory distress. He has wheezes ( mild, diffuse). He has no rales. He exhibits no tenderness.  Abdominal: Soft. Bowel sounds are normal. He exhibits no distension and no mass. There is no tenderness. There is no rebound and no guarding.  Musculoskeletal: Normal range of motion.  Lymphadenopathy:    He has no cervical adenopathy.  Neurological: He is alert.  Skin: Skin is warm and dry.    ED Course  Procedures (including critical care time)  Labs Review Labs Reviewed  RAPID STREP SCREEN  CULTURE, GROUP A STREP   Imaging Review Dg Chest 2 View  10/11/2013   CLINICAL DATA:  Congestion and cough.  EXAM: CHEST  2 VIEW  COMPARISON:  None.  FINDINGS: Two views of the chest were obtained. There are slightly coarse lung markings which may represent chronic changes. No evidence for edema or airspace disease. Heart and mediastinum are within normal limits. Trachea is midline. Negative for  pleural effusions. Bony thorax is intact.  IMPRESSION: No acute chest findings.  Coarse lung markings may represent chronic changes.   Electronically Signed   By: Richarda Overlie M.D.   On: 10/11/2013 09:20    EKG Interpretation   None       MDM   1. URI, acute   2. Sore throat   3. Active smoker    Pt co sore throat, cough, congestion and body aches. Pt does have hoarse voice but no respiratory distress. No stridor.   Rapid strep performed in triage: negative CXR: no acute chest findings, coarse lung markings may represent chronic changes.  Discussed importance of smoking cessation. Provided pt info packet on Tips for Smoking Cessation.   Will tx symptoms with azithromycin and sudafed due to duration as well as pt's hx of smoking and working in dusty environment without face mask.  All labs/imaging/findings discussed with patient. All questions answered and concerns addressed. Will discharge pt home and have pt f/u with his PCP, Dr. Carolyne Fiscal. Return precautions given. Pt verbalized understanding and agreement with tx plan. Vitals: unremarkable. Discharged in stable condition.         Junius Finner, PA-C 10/11/13 [phone]

## 2013-10-12 NOTE — ED Provider Notes (Signed)
Medical screening examination/treatment/procedure(s) were performed by non-physician practitioner and as supervising physician I was immediately available for consultation/collaboration.  EKG Interpretation   None         Audree Camel, MD 10/12/13 [phone]

## 2013-10-13 LAB — CULTURE, GROUP A STREP

## 2014-03-01 ENCOUNTER — Encounter (HOSPITAL_COMMUNITY): Payer: Self-pay | Admitting: Emergency Medicine

## 2014-03-01 ENCOUNTER — Emergency Department (HOSPITAL_COMMUNITY)
Admission: EM | Admit: 2014-03-01 | Discharge: 2014-03-01 | Disposition: A | Discharge status: Home / Self Care | Payer: 59 | Attending: Emergency Medicine | Admitting: Emergency Medicine

## 2014-03-01 DIAGNOSIS — F17200 Nicotine dependence, unspecified, uncomplicated: Secondary | ICD-10-CM | POA: Insufficient documentation

## 2014-03-01 DIAGNOSIS — K047 Periapical abscess without sinus: Secondary | ICD-10-CM | POA: Insufficient documentation

## 2014-03-01 MED ORDER — PENICILLIN V POTASSIUM 500 MG PO TABS: 500.0000 mg | ORAL_TABLET | Freq: Four times a day (QID) | ORAL | Status: DC

## 2014-03-01 MED ORDER — HYDROCODONE-ACETAMINOPHEN 5-325 MG PO TABS: 1.0000 | ORAL_TABLET | ORAL | Status: DC | PRN

## 2014-03-01 MED ORDER — PENICILLIN V POTASSIUM 500 MG PO TABS
500.0000 mg | ORAL_TABLET | Freq: Once | ORAL | Status: AC
  Administered 2014-03-01: 500 mg via ORAL
  Filled 2014-03-01: qty 1

## 2014-03-01 MED ORDER — HYDROCODONE-ACETAMINOPHEN 5-325 MG PO TABS
2.0000 | ORAL_TABLET | Freq: Once | ORAL | Status: AC
Start: 2014-03-01 — End: 2014-03-01
  Administered 2014-03-01: 2 via ORAL
  Filled 2014-03-01: qty 2

## 2014-03-01 NOTE — ED Provider Notes (Signed)
CSN: [id number]     Arrival date & time 03/01/14  2047 History  This chart was scribed for non-physician practitioner, Hazel Sams, PA-C working with Dot Lanes, MD by Einar Pheasant, ED scribe. This patient was seen in room WTR7/WTR7 and the patient's care was started at 11:18 PM.    Chief Complaint  Patient presents with  . Dental Pain    The history is provided by the patient. No language interpreter was used.   HPI Comments: Preston Sullivan is a 48 y.o. male who presents to the Emergency Department complaining of worsening dental pain that started 2 days ago. Pt states he has a scheduled appointment with his dentist on Thursday, but he states that he cannot tolerate the pain any longer. He is also complaining of associated right sided facial swelling. He reports taking Ibuprofen with minimal relief. Denies any difficulty breathing, SOB, nausea, fever, chills, diaphoresis, or emesis.   History reviewed. No pertinent past medical history. Past Surgical History  Procedure Laterality Date  . Appendectomy     History reviewed. No pertinent family history. History  Substance Use Topics  . Smoking status: Current Every Day Smoker -- 0.50 packs/day    Types: Cigarettes  . Smokeless tobacco: Not on file  . Alcohol Use: No    Review of Systems  Constitutional: Negative for fever, chills and diaphoresis.  HENT: Positive for dental problem.   Gastrointestinal: Negative for nausea, vomiting and abdominal pain.  All other systems reviewed and are negative.   Allergies  Review of patient's allergies indicates no known allergies.  Home Medications   Current Outpatient Rx  Name  Route  Sig  Dispense  Refill  . azithromycin (ZITHROMAX) 250 MG tablet   Oral   Take 1 tablet (250 mg total) by mouth daily. Take first 2 tablets together, then 1 every day until finished.   6 tablet   0   . dextromethorphan-guaiFENesin (MUCINEX DM) 30-600 MG per 12 hr tablet   Oral   Take 1 tablet by  mouth every 12 (twelve) hours.         . pseudoephedrine (SUDAFED) 60 MG tablet   Oral   Take 1 tablet (60 mg total) by mouth every 6 (six) hours as needed for congestion.   15 tablet   0    BP 130/90  Pulse 63  Temp(Src) 98.3 F (36.8 C) (Oral)  Resp 20  Ht 5\' 6"  (1.676 m)  Wt 195 lb (88.451 kg)  BMI 31.49 kg/m2  SpO2 100%  Physical Exam  Nursing note and vitals reviewed. Constitutional: He is oriented to person, place, and time. He appears well-developed and well-nourished.  HENT:  Head: Normocephalic and atraumatic.  Poor dentition. Tenderness to his right upper first pre-molar. Pt has a few metal cavities. Swelling to the nasal-labial fold.  Cardiovascular: Normal rate.   Pulmonary/Chest: Effort normal.  Abdominal: He exhibits no distension.  Neurological: He is alert and oriented to person, place, and time.  Skin: Skin is warm and dry.  Psychiatric: He has a normal mood and affect.    ED Course  Procedures   DIAGNOSTIC STUDIES: Oxygen Saturation is 100% on RA, normal by my interpretation.    COORDINATION OF CARE: 11:21 PM- Will prescribe pain medication. Pt advised of plan for treatment and pt agrees.   MDM   Final diagnoses:  Periapical abscess with facial involvement    I personally performed the services described in this documentation, which was scribed in my presence.  The recorded information has been reviewed and is accurate.    Martie Lee, PA-C 03/03/14 [phone]

## 2014-03-01 NOTE — ED Notes (Signed)
Pt states he has a toothache  Pt states the pain started on Saturday  Pt states it is on the right top

## 2014-03-01 NOTE — Discharge Instructions (Signed)
Please followup with the dentist for continued evaluation and treatment.    Abscessed Tooth An abscessed tooth is an infection around your tooth. It may be caused by holes or damage to the tooth (cavity) or a dental disease. An abscessed tooth causes mild to very bad pain in and around the tooth. See your dentist right away if you have tooth or gum pain. HOME CARE  Take your medicine as told. Finish it even if you start to feel better.  Do not drive after taking pain medicine.  Rinse your mouth (gargle) often with salt water ( teaspoon salt in 8 ounces of warm water).  Do not apply heat to the outside of your face. GET HELP RIGHT AWAY IF:   You have a temperature by mouth above 102 F (38.9 C), not controlled by medicine.  You have chills and a very bad headache.  You have problems breathing or swallowing.  Your mouth will not open.  You develop puffiness (swelling) on the neck or around the eye.  Your pain is not helped by medicine.  Your pain is getting worse instead of better. MAKE SURE YOU:   Understand these instructions.  Will watch your condition.  Will get help right away if you are not doing well or get worse. Document Released: 05/14/2008 Document Revised: 02/18/2012 Document Reviewed: 03/06/2011 [facility] Patient Information 2014 [employer].

## 2014-03-05 NOTE — ED Provider Notes (Signed)
Medical screening examination/treatment/procedure(s) were performed by non-physician practitioner and as supervising physician I was immediately available for consultation/collaboration.   Dot Lanes, MD 03/05/14 1016

## 2016-11-05 ENCOUNTER — Emergency Department (HOSPITAL_COMMUNITY)
Admission: EM | Admit: 2016-11-05 | Discharge: 2016-11-05 | Disposition: A | Discharge status: Home / Self Care | Payer: Self-pay | Attending: Emergency Medicine | Admitting: Emergency Medicine

## 2016-11-05 DIAGNOSIS — Y999 Unspecified external cause status: Secondary | ICD-10-CM | POA: Insufficient documentation

## 2016-11-05 DIAGNOSIS — Y929 Unspecified place or not applicable: Secondary | ICD-10-CM | POA: Insufficient documentation

## 2016-11-05 DIAGNOSIS — X500XXA Overexertion from strenuous movement or load, initial encounter: Secondary | ICD-10-CM | POA: Insufficient documentation

## 2016-11-05 DIAGNOSIS — Z79899 Other long term (current) drug therapy: Secondary | ICD-10-CM | POA: Insufficient documentation

## 2016-11-05 DIAGNOSIS — F17210 Nicotine dependence, cigarettes, uncomplicated: Secondary | ICD-10-CM | POA: Insufficient documentation

## 2016-11-05 DIAGNOSIS — Y9389 Activity, other specified: Secondary | ICD-10-CM | POA: Insufficient documentation

## 2016-11-05 DIAGNOSIS — S29012A Strain of muscle and tendon of back wall of thorax, initial encounter: Secondary | ICD-10-CM | POA: Insufficient documentation

## 2016-11-05 MED ORDER — IBUPROFEN 800 MG PO TABS: 800.0000 mg | ORAL_TABLET | Freq: Three times a day (TID) | ORAL | 0 refills | Status: DC

## 2016-11-05 MED ORDER — CYCLOBENZAPRINE HCL 10 MG PO TABS: 10.0000 mg | ORAL_TABLET | Freq: Two times a day (BID) | ORAL | 0 refills | Status: DC | PRN

## 2016-11-05 NOTE — ED Notes (Signed)
PT DISCHARGED. INSTRUCTIONS AND PRESCRIPTIONS GIVEN. AAOX4. PT IN NO APPARENT DISTRESS. THE OPPORTUNITY TO ASK QUESTIONS WAS PROVIDED. 

## 2016-11-05 NOTE — ED Triage Notes (Signed)
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 Alert and oriented.

## 2016-11-05 NOTE — ED Provider Notes (Signed)
[facility] DEPT Provider Note   CSN: [id number] Arrival date & time: 11/05/16  1745  By signing my name below, I, Irene Pap, attest that this documentation has been prepared under the direction and in the presence of Domenic Moras, PA-C. Electronically Signed: Irene Pap, ED Scribe. 11/05/16. 7:04 PM.  History   Chief Complaint Chief Complaint  Patient presents with  . Back Pain   The history is provided by the patient. No language interpreter was used.   HPI Comments: Preston Sullivan is a 50 y.o. male who presents to the Emergency Department complaining of gradually worsening mid back pain onset earlier today. 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 Pt has worsening pain with coughing or deep breathing. Pt reports hx of back problems with a slipped disc. He denies chest pain, SOB, abdominal pain, rash, wound, bladder or bowel incontinence, numbness, or weakness.   No past medical history on file.  Patient Active Problem List   Diagnosis Date Noted  . SHOULDER PAIN, BILATERAL 08/09/2010  . VITAMIN D DEFICIENCY 12/13/2009  . GLUCOSE INTOLERANCE 12/13/2009  . DENTAL CARIES 12/13/2009  . BACK PAIN 12/13/2009  . LIVER PAIN 11/29/2009  . TOBACCO USER 10/31/2009  . DEPRESSION 10/31/2009  . ALLERGIC RHINITIS 10/31/2009  . GERD 10/31/2009    Past Surgical History:  Procedure Laterality Date  . APPENDECTOMY       Home Medications    Prior to Admission medications   Medication Sig Start Date End Date Taking? Authorizing Provider  HYDROcodone-acetaminophen (NORCO/VICODIN) 5-325 MG per tablet Take 1-2 tablets by mouth every 4 (four) hours as needed for moderate pain. 03/01/14   Hazel Sams, PA-C  penicillin v potassium (VEETID) 500 MG tablet Take 1 tablet (500 mg total) by mouth 4 (four) times daily. 03/01/14 03/08/14  Hazel Sams, PA-C    Family History No family history on file.  Social History Social History  Substance Use  Topics  . Smoking status: Current Every Day Smoker    Packs/day: 0.50    Types: Cigarettes  . Smokeless tobacco: Not on file  . Alcohol use No     Allergies   Patient has no known allergies.   Review of Systems Review of Systems  Respiratory: Negative for shortness of breath.   Cardiovascular: Negative for chest pain.  Gastrointestinal: Negative for abdominal pain.  Musculoskeletal: Positive for back pain.  Skin: Negative for rash and wound.  Neurological: Negative for weakness and numbness.     Physical Exam Updated Vital Signs BP 125/71 (BP Location: Left Arm)   Pulse 66   Temp 98.1 F (36.7 C) (Oral)   Resp 16   SpO2 100%   Physical Exam  Constitutional: He is oriented to person, place, and time. He appears well-developed and well-nourished.  HENT:  Head: Normocephalic and atraumatic.  Eyes: Conjunctivae are normal.  Neck: Normal range of motion. Neck supple.  Musculoskeletal: Normal range of motion.  TTP to thoracic spine at level T10-T12 and right parathoracic musculature without any overlying skin changes.   Neurological: He is alert and oriented to person, place, and time.  Skin: Skin is warm and dry.  Psychiatric: He has a normal mood and affect. His behavior is normal.  Nursing note and vitals reviewed.  ED Treatments / Results  DIAGNOSTIC STUDIES: Oxygen Saturation is 100% on RA, normal by my interpretation.    COORDINATION OF CARE: 7:19 PM-Discussed treatment plan which includes heat, ice, anti-inflammatories, and  muscle relaxant with pt at bedside and pt agreed to plan.    Labs (all labs ordered are listed, but only abnormal results are displayed) Labs Reviewed - No data to display  EKG  EKG Interpretation None       Radiology No results found.  Procedures Procedures (including critical care time)  Medications Ordered in ED Medications - No data to display   Initial Impression / Assessment and Plan / ED Course  I have reviewed  the triage vital signs and the nursing notes.  Pertinent labs & imaging results that were available during my care of the patient were reviewed by me and considered in my medical decision making (see chart for details).  Clinical Course    BP 125/71 (BP Location: Left Arm)   Pulse 66   Temp 98.1 F (36.7 C) (Oral)   Resp 16   SpO2 100%   Patient with back pain.  No neurological deficits and normal neuro exam.  Patient can walk but states is painful.  No loss of bowel or bladder control.  No concern for cauda equina.  No fever, night sweats, weight loss, h/o cancer, IVDU.  RICE protocol and pain medicine indicated and discussed with patient.   Final Clinical Impressions(s) / ED Diagnoses   Final diagnoses:  Strain of mid-back, initial encounter  I personally performed the services described in this documentation, which was scribed in my presence. The recorded information has been reviewed and is accurate.     New Prescriptions New Prescriptions   CYCLOBENZAPRINE (FLEXERIL) 10 MG TABLET    Take 1 tablet (10 mg total) by mouth 2 (two) times daily as needed for muscle spasms.   IBUPROFEN (ADVIL,MOTRIN) 800 MG TABLET    Take 1 tablet (800 mg total) by mouth 3 (three) times daily.     Domenic Moras, PA-C 11/05/16 1937    Leo Grosser, MD 11/06/16 [phone]

## 2016-11-09 ENCOUNTER — Encounter (HOSPITAL_BASED_OUTPATIENT_CLINIC_OR_DEPARTMENT_OTHER): Payer: Self-pay | Admitting: Emergency Medicine

## 2016-11-09 ENCOUNTER — Inpatient Hospital Stay (HOSPITAL_BASED_OUTPATIENT_CLINIC_OR_DEPARTMENT_OTHER)
Admission: EM | Admit: 2016-11-09 | Discharge: 2016-11-10 | DRG: 313 | Disposition: A | Discharge status: Home / Self Care | Payer: Self-pay | Attending: Internal Medicine | Admitting: Internal Medicine

## 2016-11-09 ENCOUNTER — Emergency Department (HOSPITAL_BASED_OUTPATIENT_CLINIC_OR_DEPARTMENT_OTHER): Payer: Self-pay

## 2016-11-09 DIAGNOSIS — I200 Unstable angina: Secondary | ICD-10-CM | POA: Diagnosis present

## 2016-11-09 DIAGNOSIS — Z8249 Family history of ischemic heart disease and other diseases of the circulatory system: Secondary | ICD-10-CM

## 2016-11-09 DIAGNOSIS — Z6829 Body mass index (BMI) 29.0-29.9, adult: Secondary | ICD-10-CM

## 2016-11-09 DIAGNOSIS — F17210 Nicotine dependence, cigarettes, uncomplicated: Secondary | ICD-10-CM | POA: Diagnosis present

## 2016-11-09 DIAGNOSIS — R079 Chest pain, unspecified: Secondary | ICD-10-CM

## 2016-11-09 DIAGNOSIS — R0789 Other chest pain: Principal | ICD-10-CM | POA: Diagnosis present

## 2016-11-09 DIAGNOSIS — E669 Obesity, unspecified: Secondary | ICD-10-CM | POA: Diagnosis present

## 2016-11-09 LAB — TROPONIN I: Troponin I: <0.03 ng/mL (ref <0.03)

## 2016-11-09 LAB — CBC WITH DIFFERENTIAL/PLATELET
Basophils Absolute: 0 10*3/uL (ref 0.0–0.1)
Basophils Relative: 1 %
Eosinophils Absolute: 0.2 10*3/uL (ref 0.0–0.7)
Eosinophils Relative: 3 %
HCT: 41 % (ref 39.0–52.0)
Hemoglobin: 13.9 g/dL (ref 13.0–17.0)
Lymphocytes Relative: 38 %
Lymphs Abs: 2.6 10*3/uL (ref 0.7–4.0)
MCH: 30.1 pg (ref 26.0–34.0)
MCHC: 33.9 g/dL (ref 30.0–36.0)
MCV: 88.7 fL (ref 78.0–100.0)
Monocytes Absolute: 0.5 10*3/uL (ref 0.1–1.0)
Monocytes Relative: 7 %
Neutro Abs: 3.6 10*3/uL (ref 1.7–7.7)
Neutrophils Relative %: 51 %
Platelets: 185 10*3/uL (ref 150–400)
RBC: 4.62 MIL/uL (ref 4.22–5.81)
RDW: 14 % (ref 11.5–15.5)
WBC: 7 10*3/uL (ref 4.0–10.5)

## 2016-11-09 LAB — BASIC METABOLIC PANEL
Anion gap: 7 (ref 5–15)
BUN: 13 mg/dL (ref 6–20)
CO2: 25 mmol/L (ref 22–32)
Calcium: 9 mg/dL (ref 8.9–10.3)
Chloride: 105 mmol/L (ref 101–111)
Creatinine, Ser: 0.54 mg/dL — ABNORMAL LOW (ref 0.61–1.24)
GFR calc Af Amer: >60 mL/min (ref >60)
GFR calc non Af Amer: >60 mL/min (ref >60)
Glucose, Bld: 92 mg/dL (ref 65–99)
Potassium: 3.9 mmol/L (ref 3.5–5.1)
Sodium: 137 mmol/L (ref 135–145)

## 2016-11-09 MED ORDER — NITROGLYCERIN 0.4 MG SL SUBL
SUBLINGUAL_TABLET | SUBLINGUAL | Status: AC
  Filled 2016-11-09: qty 3

## 2016-11-09 MED ORDER — ASPIRIN 81 MG PO CHEW
324.0000 mg | CHEWABLE_TABLET | Freq: Once | ORAL | Status: AC
  Administered 2016-11-09: 324 mg via ORAL

## 2016-11-09 MED ORDER — NITROGLYCERIN 0.4 MG SL SUBL
0.4000 mg | SUBLINGUAL_TABLET | SUBLINGUAL | Status: DC | PRN
  Administered 2016-11-09: 0.4 mg via SUBLINGUAL

## 2016-11-09 MED ORDER — ASPIRIN 81 MG PO CHEW
CHEWABLE_TABLET | ORAL | Status: AC
  Filled 2016-11-09: qty 4

## 2016-11-09 NOTE — ED Notes (Signed)
Pt reports onset of chest pain that comes and goes for about two weeks. Mainly occurs while at work. Describes as a tightness associated with shortness of breath and dizziness.

## 2016-11-09 NOTE — ED Provider Notes (Signed)
[facility] Provider Note: Georgena Spurling, MD, FACEP  CSN: [id number] MRN: [id number] ARRIVAL: 11/09/16 at [place]: [id number]  By signing my name below, I, Soijett Blue, attest that this documentation has been prepared under the direction and in the presence of Shanon Rosser, MD. Electronically Signed: Soijett Blue, ED Scribe. 11/09/16. 11:23 PM.  CHIEF COMPLAINT  Chest Pain   HISTORY OF PRESENT ILLNESS  Preston Sullivan is a 50 y.o. male who presents to the Emergency Department complaining of intermittent, sharp, substernal CP onset 2 weeks ago. He notes that he was at work when the CP began. Pt reports that when his CP is at its worst it is "real bad" (7 to 10) but it presently mild. Pt states that his sternal CP is worsened with exertion and alleviated with rest. It also somewhat worse with taking deep breaths. Pt denies his sternal CP radiating to his arm or jaw at this time but did have some numbness in his left arm earlier. The chest pain is associated with SOB and mild nausea; he denies diaphoresis. He states that he has not tried any medications for the relief of his symptoms. He denies diaphoresis and any other symptoms. He is a current cigarette smoker. 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  He is having back pain for which he was seen earlier this week.   History reviewed. No pertinent past medical history.  Past Surgical History:  Procedure Laterality Date  . APPENDECTOMY      History reviewed. No pertinent family history.  Social History  Substance Use Topics  . Smoking status: Current Every Day Smoker    Packs/day: 0.50    Types: Cigarettes  . Smokeless tobacco: Never Used  . Alcohol use No    Prior to Admission medications   Medication Sig Start Date End Date Taking? Authorizing Provider  cyclobenzaprine (FLEXERIL) 10 MG tablet Take 1 tablet (10 mg total) by mouth 2 (two) times daily as needed for  muscle spasms. 11/05/16   Domenic Moras, PA-C  HYDROcodone-acetaminophen (NORCO/VICODIN) 5-325 MG per tablet Take 1-2 tablets by mouth every 4 (four) hours as needed for moderate pain. 03/01/14   Hazel Sams, PA-C  ibuprofen (ADVIL,MOTRIN) 800 MG tablet Take 1 tablet (800 mg total) by mouth 3 (three) times daily. 11/05/16   Domenic Moras, PA-C  penicillin v potassium (VEETID) 500 MG tablet Take 1 tablet (500 mg total) by mouth 4 (four) times daily. 03/01/14 03/08/14  Hazel Sams, PA-C    Allergies Patient has no known allergies.   REVIEW OF SYSTEMS  Negative except as noted here or in the History of Present Illness.   PHYSICAL EXAMINATION  Initial Vital Signs Blood pressure 123/78, pulse 68, temperature 98 F (36.7 C), temperature source Oral, resp. rate 11, height 5\' 6"  (1.676 m), weight 200 lb (90.7 kg), SpO2 100 %.  Examination General: Well-developed, well-nourished male in no acute distress; appearance consistent with age of record HENT: normocephalic; atraumatic Eyes: pupils equal, round and reactive to light; extraocular muscles intact Neck: supple Heart: regular rate and rhythm; no murmurs, rubs or gallops Lungs: clear to auscultation bilaterally Abdomen: soft; nondistended; nontender; no masses or hepatosplenomegaly; bowel sounds present Back: Pain on movement of back Extremities: No deformity; full range of motion; pulses normal Neurologic: Awake, alert and oriented; motor function intact in all extremities and symmetric; no facial droop Skin: Warm and dry Psychiatric: Normal mood and affect   RESULTS  Summary of this visit's results, reviewed by myself:   EKG Interpretation  Date/Time:  Friday November 09 2016 19:32:52 EST Ventricular Rate:  87 PR Interval:  138 QRS Duration: 86 QT Interval:  356 QTC Calculation: 428 R Axis:   32 Text Interpretation:  Normal sinus rhythm Possible Left atrial enlargement Borderline ECG No old tracing to compare Confirmed by Winfred Leeds   MD, SAM [phone]) on 11/09/2016 7:39:24 PM      Laboratory Studies: Results for orders placed or performed during the hospital encounter of 11/09/16 (from the past 24 hour(s))  Basic metabolic panel     Status: Abnormal   Collection Time: 11/09/16 11:12 PM  Result Value Ref Range   Sodium 137 135 - 145 mmol/L   Potassium 3.9 3.5 - 5.1 mmol/L   Chloride 105 101 - 111 mmol/L   CO2 25 22 - 32 mmol/L   Glucose, Bld 92 65 - 99 mg/dL   BUN 13 6 - 20 mg/dL   Creatinine, Ser 0.54 (L) 0.61 - 1.24 mg/dL   Calcium 9.0 8.9 - 10.3 mg/dL   GFR calc non Af Amer >60 >60 mL/min   GFR calc Af Amer >60 >60 mL/min   Anion gap 7 5 - 15  Troponin I     Status: None   Collection Time: 11/09/16 11:12 PM  Result Value Ref Range   Troponin I <0.03 <0.03 ng/mL  CBC with Differential/Platelet     Status: None   Collection Time: 11/09/16 11:12 PM  Result Value Ref Range   WBC 7.0 4.0 - 10.5 K/uL   RBC 4.62 4.22 - 5.81 MIL/uL   Hemoglobin 13.9 13.0 - 17.0 g/dL   HCT 41.0 39.0 - 52.0 %   MCV 88.7 78.0 - 100.0 fL   MCH 30.1 26.0 - 34.0 pg   MCHC 33.9 30.0 - 36.0 g/dL   RDW 14.0 11.5 - 15.5 %   Platelets 185 150 - 400 K/uL   Neutrophils Relative % 51 %   Neutro Abs 3.6 1.7 - 7.7 K/uL   Lymphocytes Relative 38 %   Lymphs Abs 2.6 0.7 - 4.0 K/uL   Monocytes Relative 7 %   Monocytes Absolute 0.5 0.1 - 1.0 K/uL   Eosinophils Relative 3 %   Eosinophils Absolute 0.2 0.0 - 0.7 K/uL   Basophils Relative 1 %   Basophils Absolute 0.0 0.0 - 0.1 K/uL   Imaging Studies: Dg Chest 2 View  Result Date: 11/09/2016 CLINICAL DATA:  Chest pain, shortness of breath, and dizziness for 2 days. EXAM: CHEST  2 VIEW COMPARISON:  10/11/2013 FINDINGS: The heart size and mediastinal contours are within normal limits. Chronic central peribronchial thickening is stable. No evidence of pulmonary infiltrate or edema. The visualized skeletal structures are unremarkable. IMPRESSION: Stable exam.  No active cardiopulmonary disease.  Electronically Signed   By: Earle Gell M.D.   On: 11/09/2016 20:28    ED COURSE  Nursing notes and initial vitals signs, including pulse oximetry, reviewed.  Vitals:   11/09/16 2130 11/09/16 2200 11/09/16 2343 11/10/16 0000  BP: 114/71 123/78 112/60 103/66  Pulse: 71 68 63 67  Resp: 18 11 14 22   Temp:      TempSrc:      SpO2: 99% 100% 99% 100%  Weight:      Height:       12:21 AM Pain completely relieved with one sublingual nitroglycerin.  1:30 AM Dr. Karlyn Agee accepts for transfer to [facility]. He recommends Lovenox and  metoprolol 12.5mg .  PROCEDURES    ED DIAGNOSES     ICD-9-CM ICD-10-CM   1. Unstable angina (HCC) 411.1 I20.0     I personally performed the services described in this documentation, which was scribed in my presence. The recorded information has been reviewed and is accurate.     Shanon Rosser, MD 11/10/16 [phone]

## 2016-11-10 ENCOUNTER — Inpatient Hospital Stay (HOSPITAL_COMMUNITY): Payer: Self-pay

## 2016-11-10 ENCOUNTER — Encounter (HOSPITAL_COMMUNITY): Payer: Self-pay | Admitting: *Deleted

## 2016-11-10 DIAGNOSIS — I200 Unstable angina: Secondary | ICD-10-CM

## 2016-11-10 DIAGNOSIS — I208 Other forms of angina pectoris: Secondary | ICD-10-CM

## 2016-11-10 DIAGNOSIS — R079 Chest pain, unspecified: Secondary | ICD-10-CM

## 2016-11-10 LAB — CBC
HCT: 40.6 % (ref 39.0–52.0)
Hemoglobin: 13.5 g/dL (ref 13.0–17.0)
MCH: 29.5 pg (ref 26.0–34.0)
MCHC: 33.3 g/dL (ref 30.0–36.0)
MCV: 88.6 fL (ref 78.0–100.0)
Platelets: 212 10*3/uL (ref 150–400)
RBC: 4.58 MIL/uL (ref 4.22–5.81)
RDW: 14.3 % (ref 11.5–15.5)
WBC: 7.1 10*3/uL (ref 4.0–10.5)

## 2016-11-10 LAB — NM MYOCAR MULTI W/SPECT W/WALL MOTION / EF: Rest HR: 57 {beats}/min

## 2016-11-10 LAB — BASIC METABOLIC PANEL
Anion gap: 8 (ref 5–15)
BUN: 10 mg/dL (ref 6–20)
CO2: 25 mmol/L (ref 22–32)
Calcium: 9 mg/dL (ref 8.9–10.3)
Chloride: 104 mmol/L (ref 101–111)
Creatinine, Ser: 0.65 mg/dL (ref 0.61–1.24)
GFR calc Af Amer: >60 mL/min (ref >60)
GFR calc non Af Amer: >60 mL/min (ref >60)
Glucose, Bld: 91 mg/dL (ref 65–99)
Potassium: 4 mmol/L (ref 3.5–5.1)
Sodium: 137 mmol/L (ref 135–145)

## 2016-11-10 LAB — TROPONIN I: Troponin I: <0.03 ng/mL (ref <0.03)

## 2016-11-10 MED ORDER — ACETAMINOPHEN 325 MG PO TABS
650.0000 mg | ORAL_TABLET | Freq: Four times a day (QID) | ORAL | Status: DC | PRN
  Administered 2016-11-10: 650 mg via ORAL
  Filled 2016-11-10: qty 2

## 2016-11-10 MED ORDER — NITROGLYCERIN 2 % TD OINT
TOPICAL_OINTMENT | TRANSDERMAL | Status: AC
  Filled 2016-11-10: qty 1

## 2016-11-10 MED ORDER — METOPROLOL TARTRATE 12.5 MG HALF TABLET
12.5000 mg | ORAL_TABLET | Freq: Two times a day (BID) | ORAL | Status: DC
  Filled 2016-11-10: qty 1

## 2016-11-10 MED ORDER — NITROGLYCERIN 2 % TD OINT
1.0000 [in_us] | TOPICAL_OINTMENT | Freq: Once | TRANSDERMAL | Status: AC
  Administered 2016-11-10: 1 [in_us] via TOPICAL

## 2016-11-10 MED ORDER — ENOXAPARIN SODIUM 100 MG/ML ~~LOC~~ SOLN
1.0000 mg/kg | Freq: Once | SUBCUTANEOUS | Status: AC
  Administered 2016-11-10: 90 mg via SUBCUTANEOUS

## 2016-11-10 MED ORDER — REGADENOSON 0.4 MG/5ML IV SOLN
0.4000 mg | Freq: Once | INTRAVENOUS | Status: AC
  Administered 2016-11-10: 0.4 mg via INTRAVENOUS

## 2016-11-10 MED ORDER — TECHNETIUM TC 99M TETROFOSMIN IV KIT
30.0000 | PACK | Freq: Once | INTRAVENOUS | Status: AC | PRN
  Administered 2016-11-10: 30 via INTRAVENOUS

## 2016-11-10 MED ORDER — ENOXAPARIN SODIUM 80 MG/0.8ML ~~LOC~~ SOLN: 80.0000 mg | Freq: Once | SUBCUTANEOUS | Status: DC

## 2016-11-10 MED ORDER — AMINOPHYLLINE 25 MG/ML IV SOLN
INTRAVENOUS | Status: AC
  Filled 2016-11-10: qty 10

## 2016-11-10 MED ORDER — NITROGLYCERIN 0.4 MG SL SUBL: 0.4000 mg | SUBLINGUAL_TABLET | SUBLINGUAL | 12 refills | Status: DC | PRN

## 2016-11-10 MED ORDER — TECHNETIUM TC 99M TETROFOSMIN IV KIT
10.0000 | PACK | Freq: Once | INTRAVENOUS | Status: AC | PRN
  Administered 2016-11-10: 10 via INTRAVENOUS

## 2016-11-10 MED ORDER — ASPIRIN EC 81 MG PO TBEC
81.0000 mg | DELAYED_RELEASE_TABLET | Freq: Every day | ORAL | Status: DC
  Administered 2016-11-10: 81 mg via ORAL
  Filled 2016-11-10: qty 1

## 2016-11-10 MED ORDER — REGADENOSON 0.4 MG/5ML IV SOLN
INTRAVENOUS | Status: AC
  Filled 2016-11-10: qty 5

## 2016-11-10 MED ORDER — METOPROLOL TARTRATE 12.5 MG HALF TABLET
12.5000 mg | ORAL_TABLET | Freq: Once | ORAL | Status: AC
  Administered 2016-11-10: 12.5 mg via ORAL
  Filled 2016-11-10: qty 1

## 2016-11-10 MED ORDER — HEPARIN (PORCINE) IN NACL 100-0.45 UNIT/ML-% IJ SOLN: 1100.0000 [IU]/h | INTRAMUSCULAR | Status: DC

## 2016-11-10 MED ORDER — ENOXAPARIN SODIUM 80 MG/0.8ML ~~LOC~~ SOLN: 80.0000 mg | Freq: Two times a day (BID) | SUBCUTANEOUS | Status: DC

## 2016-11-10 MED ORDER — ENOXAPARIN SODIUM 100 MG/ML ~~LOC~~ SOLN
SUBCUTANEOUS | Status: AC
  Filled 2016-11-10: qty 1

## 2016-11-10 MED ORDER — METOPROLOL TARTRATE 50 MG PO TABS
ORAL_TABLET | ORAL | Status: AC
  Filled 2016-11-10: qty 1

## 2016-11-10 NOTE — Progress Notes (Signed)
ANTICOAGULATION CONSULT NOTE - Initial Consult  Pharmacy Consult for Lovenox Indication: chest pain/ACS  No Known Allergies  Patient Measurements: Height: 5\' 7"  (170.2 cm) Weight: 190 lb 9.6 oz (86.5 kg) IBW/kg (Calculated) : 66.1  Vital Signs: Temp: 97.9 F (36.6 C) (12/02 0254) Temp Source: Oral (12/02 0254) BP: 125/51 (12/02 1225) Pulse Rate: 62 (12/02 0254)  Labs:  Recent Labs  11/09/16 2312 11/10/16 0342  HGB 13.9 13.5  HCT 41.0 40.6  PLT 185 212  CREATININE 0.54* 0.65  TROPONINI <0.03 <0.03    Estimated Creatinine Clearance: 113.5 mL/min (by C-G formula based on SCr of 0.65 mg/dL).   Medical History: History reviewed. No pertinent past medical history.  Medications:  Prescriptions Prior to Admission  Medication Sig Dispense Refill Last Dose  . ibuprofen (ADVIL,MOTRIN) 800 MG tablet Take 1 tablet (800 mg total) by mouth 3 (three) times daily. 21 tablet 0 11/09/2016 at Unknown time  . cyclobenzaprine (FLEXERIL) 10 MG tablet Take 1 tablet (10 mg total) by mouth 2 (two) times daily as needed for muscle spasms. (Patient not taking: Reported on 11/10/2016) 20 tablet 0 Completed Course at Unknown time  . HYDROcodone-acetaminophen (NORCO/VICODIN) 5-325 MG per tablet Take 1-2 tablets by mouth every 4 (four) hours as needed for moderate pain. (Patient not taking: Reported on 11/10/2016) 20 tablet 0 Completed Course at Unknown time  . penicillin v potassium (VEETID) 500 MG tablet Take 1 tablet (500 mg total) by mouth 4 (four) times daily. (Patient not taking: Reported on 11/10/2016) 40 tablet 0 Not Taking at Unknown time   Scheduled:  . aspirin EC  81 mg Oral Daily  . metoprolol tartrate  12.5 mg Oral BID  . regadenoson        Assessment: 50yo male c/o intermittent, sharp, substernal CP x 2 week, tx'd from [facility] for further eval, initially ordered to start heparin.  Heparin never started.  Cards PA ok to change to Lovenox.  Pt received Lovenox 80mg  at [facility] at [place].  Goal of Therapy:  Anti-Xa level 0.6-1 units/ml 4hrs after LMWH dose given Monitor platelets by anticoagulation protocol: Yes   Plan:  Lovenox 80mg  SQ q12h - next at 1600, then BID at 0600/1800.  [employer], Pharm.D., BCPS Clinical Pharmacist 11/10/2016 2:29 PM

## 2016-11-10 NOTE — H&P (Signed)
CC: Chest pain  50yoM who does not see a physician regularly, active and long-time smoker, obesity, is transferred from [facility] ER for diagnosis of unstable angina.  Patient states symptoms began two weeks ago. Occur with activity, improves with rest, last about 15 minutes.  Symptoms are a chest tightness and left arm pain, associated with diaphoresis and sob.  Sought treatment today because he had three episodes that were more intense than previously and concerned him for possible heart attack.  He denies rest pain.  He recently started flexeril and ibuprofen for back pain.  ROS is otherwise negative.  He denies f/c states he is otherwise been in good health  PMHx: History reviewed. No pertinent past medical history.  Medications No current facility-administered medications on file prior to encounter.    Current Outpatient Prescriptions on File Prior to Encounter  Medication Sig Dispense Refill  . cyclobenzaprine (FLEXERIL) 10 MG tablet Take 1 tablet (10 mg total) by mouth 2 (two) times daily as needed for muscle spasms. 20 tablet 0  . HYDROcodone-acetaminophen (NORCO/VICODIN) 5-325 MG per tablet Take 1-2 tablets by mouth every 4 (four) hours as needed for moderate pain. 20 tablet 0  . ibuprofen (ADVIL,MOTRIN) 800 MG tablet Take 1 tablet (800 mg total) by mouth 3 (three) times daily. 21 tablet 0  . penicillin v potassium (VEETID) 500 MG tablet Take 1 tablet (500 mg total) by mouth 4 (four) times daily. 40 tablet 0   Soc Hx: Social History   Social History  . Marital status: Married    Spouse name: N/A  . Number of children: N/A  . Years of education: N/A   Occupational History  . Not on file.   Social History Main Topics  . Smoking status: Current Every Day Smoker    Packs/day: 0.50    Years: 30.00    Types: Cigarettes  . Smokeless tobacco: Never Used  . Alcohol use No  . Drug use:     Types: Marijuana  . Sexual activity: Not on file   Other Topics Concern  . Not  on file   Social History Narrative  . No narrative on file   Fam Hx: Breast Cancer-relatedfamily history is not on file.  Mom had a MI in 50s, otherwise no fam hx of CAD  Exam BP 131/72 (BP Location: Left Arm)   Pulse 62   Temp 97.9 F (36.6 C) (Oral)   Resp 16   Ht 5\' 7"  (1.702 m)   Wt 86.5 kg (190 lb 9.6 oz)   SpO2 100%   BMI 29.85 kg/m  Well appearing 50y o in NAD, obese JVP normal CTAB RRR s1 s2 no mrg Soft nt nd No edema, WWP AAO without gross focal deficit  Labs 7.0 >--------< 185            41  137   105  13 ------------------< 92 3.9   25     0.54  Troponin <0.03  ECG  NSR 87, LAE, no ischemic changes  A/P 50yoM, smoker, who does not see a physician regularly, who presents with typical angina that is new and increasing in frequency and intensity.    Will treat for unstable angina  1.  Heparin gtt (noted to have received a dose of lovenox at outside ER) 2.  ASA 325 -> 81 mg qd 3.  Metoprolol  4.  Check lipids  5.  Check a1c 6.  Stress test in am vs LHC on Monday 7.  Telemetry  Full Code  Terressa Koyanagi, MD Cardiology

## 2016-11-10 NOTE — Progress Notes (Signed)
Spoke with patient. Denies chest pain. Has remained NPO. Will try to get an exercise Myoview stress test this morning.

## 2016-11-10 NOTE — Progress Notes (Signed)
ANTICOAGULATION CONSULT NOTE - Initial Consult  Pharmacy Consult for heparin Indication: chest pain/ACS  No Known Allergies  Patient Measurements: Height: 5\' 7"  (170.2 cm) Weight: 190 lb 9.6 oz (86.5 kg) IBW/kg (Calculated) : 66.1 Heparin Dosing Weight: 85kg  Vital Signs: Temp: 97.9 F (36.6 C) (12/02 0254) Temp Source: Oral (12/02 0254) BP: 131/72 (12/02 0254) Pulse Rate: 62 (12/02 0254)  Labs:  Recent Labs  11/09/16 2312  HGB 13.9  HCT 41.0  PLT 185  CREATININE 0.54*  TROPONINI <0.03    Estimated Creatinine Clearance: 113.5 mL/min (by C-G formula based on SCr of 0.54 mg/dL (L)).   Medical History: History reviewed. No pertinent past medical history.  Medications:  Prescriptions Prior to Admission  Medication Sig Dispense Refill Last Dose  . cyclobenzaprine (FLEXERIL) 10 MG tablet Take 1 tablet (10 mg total) by mouth 2 (two) times daily as needed for muscle spasms. 20 tablet 0   . HYDROcodone-acetaminophen (NORCO/VICODIN) 5-325 MG per tablet Take 1-2 tablets by mouth every 4 (four) hours as needed for moderate pain. 20 tablet 0   . ibuprofen (ADVIL,MOTRIN) 800 MG tablet Take 1 tablet (800 mg total) by mouth 3 (three) times daily. 21 tablet 0   . penicillin v potassium (VEETID) 500 MG tablet Take 1 tablet (500 mg total) by mouth 4 (four) times daily. 40 tablet 0    Scheduled:  . aspirin EC  81 mg Oral Daily  . metoprolol tartrate  12.5 mg Oral BID    Assessment: 50yo male c/i intermittent, sharp, substernal CP x2wk, tx'd from [facility] for further eval, to begin heparin.  Goal of Therapy:  Heparin level 0.3-0.7 units/ml Monitor platelets by anticoagulation protocol: Yes   Plan:  Rec'd Lovenox 80mg  SQ at [facility] at 0205; will begin heparin gtt at noon at 1100 units/hr and monitor heparin levels and CBC.  Wynona Neat, PharmD, BCPS  11/10/2016,3:44 AM

## 2016-11-10 NOTE — Progress Notes (Signed)
Interviewed and examined.  Reviewed the ECG, labs and nuclear images. He is currently asymptomatic, but his symptoms are consistent with exertional angina for several weeks. He performs a physically strenuous job. His cardiovascular exam is normal. His blood pressure is excellent. He appears fit and healthy. His ECG, cardiac enzymes and the nuclear perfusion images are all normal, he seems to be at low risk for major cardiovascular event in the near future, but his symptoms remain unexplained Nuclear perfusion study did report a borderline depressed left ventricular ejection fraction of 49%. This needs to be confirmed with outpatient echocardiography. Okay to discharge, perform outpatient echo, avoid strenuous activity until follow-up. Continue aspirin 81 mg daily and give him a prescription for sublingual nitroglycerin. I advised him about the safe use of nitroglycerin. He should sit down or lie down when taking it, should make sure he is in a safe location and not driving. If he takes 3 sublingual nitroglycerin tablets 1 every 5 minutes and still has symptoms he should immediately attention in the emergency room.  Sanda Klein, MD, [facility] [phone] office [phone] pager 11/10/2016 3:38 PM

## 2016-11-10 NOTE — Discharge Instructions (Signed)
Heart-Healthy Eating Plan °Many factors influence your heart health, including eating and exercise habits. Heart (coronary) risk increases with abnormal blood fat (lipid) levels. Heart-healthy meal planning includes limiting unhealthy fats, increasing healthy fats, and making other small dietary changes. This includes maintaining a healthy body weight to help keep lipid levels within a normal range. °What is my plan? °Your health care provider recommends that you: °· Get no more than _________% of the total calories in your daily diet from fat. °· Limit your intake of saturated fat to less than _________% of your total calories each day. °· Limit the amount of cholesterol in your diet to less than _________ mg per day. ° °What types of fat should I choose? °· Choose healthy fats more often. Choose monounsaturated and polyunsaturated fats, such as olive oil and canola oil, flaxseeds, walnuts, almonds, and seeds. °· Eat more omega-3 fats. Good choices include salmon, mackerel, sardines, tuna, flaxseed oil, and ground flaxseeds. Aim to eat fish at least two times each week. °· Limit saturated fats. Saturated fats are primarily found in animal products, such as meats, butter, and cream. Plant sources of saturated fats include palm oil, palm kernel oil, and coconut oil. °· Avoid foods with partially hydrogenated oils in them. These contain trans fats. Examples of foods that contain trans fats are stick margarine, some tub margarines, cookies, crackers, and other baked goods. °What general guidelines do I need to follow? °· Check food labels carefully to identify foods with trans fats or high amounts of saturated fat. °· Fill one half of your plate with vegetables and green salads. Eat 4-5 servings of vegetables per day. A serving of vegetables equals 1 cup of raw leafy vegetables, ½ cup of raw or cooked cut-up vegetables, or ½ cup of vegetable juice. °· Fill one fourth of your plate with whole grains. Look for the word  "whole" as the first word in the ingredient list. °· Fill one fourth of your plate with lean protein foods. °· Eat 4-5 servings of fruit per day. A serving of fruit equals one medium whole fruit, ¼ cup of dried fruit, ½ cup of fresh, frozen, or canned fruit, or ½ cup of 100% fruit juice. °· Eat more foods that contain soluble fiber. Examples of foods that contain this type of fiber are apples, broccoli, carrots, beans, peas, and barley. Aim to get 20-30 g of fiber per day. °· Eat more home-cooked food and less restaurant, buffet, and fast food. °· Limit or avoid alcohol. °· Limit foods that are high in starch and sugar. °· Avoid fried foods. °· Cook foods by using methods other than frying. Baking, boiling, grilling, and broiling are all great options. Other fat-reducing suggestions include: °? Removing the skin from poultry. °? Removing all visible fats from meats. °? Skimming the fat off of stews, soups, and gravies before serving them. °? Steaming vegetables in water or broth. °· Lose weight if you are overweight. Losing just 5-10% of your initial body weight can help your overall health and prevent diseases such as diabetes and heart disease. °· Increase your consumption of nuts, legumes, and seeds to 4-5 servings per week. One serving of dried beans or legumes equals ½ cup after being cooked, one serving of nuts equals 1½ ounces, and one serving of seeds equals ½ ounce or 1 tablespoon. °· You may need to monitor your salt (sodium) intake, especially if you have high blood pressure. Talk with your health care provider or dietitian to get   more information about reducing sodium. °What foods can I eat? °Grains ° °Breads, including French, white, pita, wheat, raisin, rye, oatmeal, and Italian. Tortillas that are neither fried nor made with lard or trans fat. Low-fat rolls, including hotdog and hamburger buns and English muffins. Biscuits. Muffins. Waffles. Pancakes. Light popcorn. Whole-grain cereals. Flatbread.  Melba toast. Pretzels. Breadsticks. Rusks. Low-fat snacks and crackers, including oyster, saltine, matzo, graham, animal, and rye. Rice and pasta, including brown rice and those that are made with whole wheat. °Vegetables °All vegetables. °Fruits °All fruits, but limit coconut. °Meats and Other Protein Sources °Lean, well-trimmed beef, veal, pork, and lamb. Chicken and turkey without skin. All fish and shellfish. Wild duck, rabbit, pheasant, and venison. Egg whites or low-cholesterol egg substitutes. Dried beans, peas, lentils, and tofu. Seeds and most nuts. °Dairy °Low-fat or nonfat cheeses, including ricotta, string, and mozzarella. Skim or 1% milk that is liquid, powdered, or evaporated. Buttermilk that is made with low-fat milk. Nonfat or low-fat yogurt. °Beverages °Mineral water. Diet carbonated beverages. °Sweets and Desserts °Sherbets and fruit ices. Honey, jam, marmalade, jelly, and syrups. Meringues and gelatins. Pure sugar candy, such as hard candy, jelly beans, gumdrops, mints, marshmallows, and small amounts of dark chocolate. Angel food cake. °Eat all sweets and desserts in moderation. °Fats and Oils °Nonhydrogenated (trans-free) margarines. Vegetable oils, including soybean, sesame, sunflower, olive, peanut, safflower, corn, canola, and cottonseed. Salad dressings or mayonnaise that are made with a vegetable oil. Limit added fats and oils that you use for cooking, baking, salads, and as spreads. °Other °Cocoa powder. Coffee and tea. All seasonings and condiments. °The items listed above may not be a complete list of recommended foods or beverages. Contact your dietitian for more options. °What foods are not recommended? °Grains °Breads that are made with saturated or trans fats, oils, or whole milk. Croissants. Butter rolls. Cheese breads. Sweet rolls. Donuts. Buttered popcorn. Chow mein noodles. High-fat crackers, such as cheese or butter crackers. °Meats and Other Protein Sources °Fatty meats, such  as hotdogs, short ribs, sausage, spareribs, bacon, ribeye roast or steak, and mutton. High-fat deli meats, such as salami and bologna. Caviar. Domestic duck and goose. Organ meats, such as kidney, liver, sweetbreads, brains, gizzard, chitterlings, and heart. °Dairy °Cream, sour cream, cream cheese, and creamed cottage cheese. Whole milk cheeses, including blue (bleu), Monterey Jack, Brie, Colby, American, Havarti, Swiss, cheddar, Camembert, and [place]. Whole or 2% milk that is liquid, evaporated, or condensed. Whole buttermilk. Cream sauce or high-fat cheese sauce. Yogurt that is made from whole milk. °Beverages °Regular sodas and drinks with added sugar. °Sweets and Desserts °Frosting. Pudding. Cookies. Cakes other than angel food cake. Candy that has milk chocolate or white chocolate, hydrogenated fat, butter, coconut, or unknown ingredients. Buttered syrups. Full-fat ice cream or ice cream drinks. °Fats and Oils °Gravy that has suet, meat fat, or shortening. Cocoa butter, hydrogenated oils, palm oil, coconut oil, palm kernel oil. These can often be found in baked products, candy, fried foods, nondairy creamers, and whipped toppings. Solid fats and shortenings, including bacon fat, salt pork, lard, and butter. Nondairy cream substitutes, such as coffee creamers and sour cream substitutes. Salad dressings that are made of unknown oils, cheese, or sour cream. °The items listed above may not be a complete list of foods and beverages to avoid. Contact your dietitian for more information. °This information is not intended to replace advice given to you by your health care provider. Make sure you discuss any questions you have with your health care   provider. °Document Released: 09/04/2008 Document Revised: 06/15/2016 Document Reviewed: 05/20/2014 °Elsevier Interactive Patient Education © 2017 [employer] ° °

## 2016-11-10 NOTE — Progress Notes (Signed)
Spoke to [employer] she stated to continue lovenox at this time. Discontinue heparin order.

## 2016-11-10 NOTE — Progress Notes (Signed)
Stress MPI is normal. No evidence of ischemia;

## 2016-11-10 NOTE — Discharge Summary (Signed)
Discharge Summary    Patient ID: Preston Sullivan,  MRN: [id number], DOB/AGE: [birth year] 52 y.o.  Admit date: 11/09/2016 Discharge date: 12/05/2016  Primary Care Provider: No PCP Per Patient Primary Cardiologist: Needs to Be Established. Saw Fellow on admit.   Discharge Diagnoses    Active Problems:   Unstable angina (HCC)   Allergies No Known Allergies  Diagnostic Studies/Procedures    Stress MPI 12/05/2016. IMPRESSION: 1. No reversible ischemia or infarction.  2. Normal left ventricular wall motion.  3. Left ventricular ejection fraction is 49%.  4. Non invasive risk stratification*: Intermediate _____________   History of Present Illness     50 year old male with no prior cardiac history, transferred from an outside ER to [facility] for unstable angina. The patient had had recent musculoskeletal strain had been on Flexeril and ibuprofen for back pain.  Seen by cardiology fellow and admitted for observation.  Hospital; Course     Consultants: None  Patient was ruled out for ACS by negative troponin and EKG Stress myoview was completed and was found to be low risk. He was felt to be having musculoskeletal pain. His symptoms were concerning for angina. If symptoms persist, may need to consider cardiac catheterization for diagnostic evaluation of CAD. For now, continue him on current medication regimen. Can consider PPI and GI evaluation as OP as well. Follow up with cardiology NP/PA as OP.   _____________  Discharge Vitals Blood pressure (!) 125/51, pulse 91, temperature 98.2 F (36.8 C), temperature source Oral, resp. rate 15, height 5\' 7"  (1.702 m), weight 190 lb 9.6 oz (86.5 kg), SpO2 94 %.  Filed Weights   11/09/16 1934 12/05/16 0254  Weight: 200 lb (90.7 kg) 190 lb 9.6 oz (86.5 kg)    Labs & Radiologic Studies     CBC  Recent Labs  11/09/16 2312 12-05-2016 0342  WBC 7.0 7.1  NEUTROABS 3.6  --   HGB 13.9 13.5  HCT 41.0 40.6  MCV 88.7 88.6  PLT 185  [id number]   Basic Metabolic Panel  Recent Labs  11/09/16 2312 Dec 05, 2016 0342  NA 137 137  K 3.9 4.0  CL 105 104  CO2 25 25  GLUCOSE 92 91  BUN 13 10  CREATININE 0.54* 0.65  CALCIUM 9.0 9.0   Liver Function Tests No results for input(s): AST, ALT, ALKPHOS, BILITOT, PROT, ALBUMIN in the last 72 hours. No results for input(s): LIPASE, AMYLASE in the last 72 hours. Cardiac Enzymes  Recent Labs  11/09/16 2312 Dec 05, 2016 0342  TROPONINI <0.03 <0.03    Dg Chest 2 View  Result Date: 11/09/2016 CLINICAL DATA:  Chest pain, shortness of breath, and dizziness for 2 days. EXAM: CHEST  2 VIEW COMPARISON:  10/11/2013 FINDINGS: The heart size and mediastinal contours are within normal limits. Chronic central peribronchial thickening is stable. No evidence of pulmonary infiltrate or edema. The visualized skeletal structures are unremarkable. IMPRESSION: Stable exam.  No active cardiopulmonary disease. Electronically Signed   By: Earle Gell M.D.   On: 11/09/2016 20:28   Nm Myocar Multi W/spect W/wall Motion / Ef  Result Date: 12-05-16 CLINICAL DATA:  Chest pain and moderate cardiac risk. EXAM: MYOCARDIAL IMAGING WITH SPECT (REST AND PHARMACOLOGIC-STRESS) GATED LEFT VENTRICULAR WALL MOTION STUDY LEFT VENTRICULAR EJECTION FRACTION TECHNIQUE: Standard myocardial SPECT imaging was performed after resting intravenous injection of 10 mCi Tc-73m tetrofosmin. Subsequently, intravenous infusion of Lexiscan was performed under the supervision of the Cardiology staff. At peak effect of the drug, 30 mCi Tc-65m  tetrofosmin was injected intravenously and standard myocardial SPECT imaging was performed. Quantitative gated imaging was also performed to evaluate left ventricular wall motion, and estimate left ventricular ejection fraction. COMPARISON:  Chest radiograph 11/09/2016 FINDINGS: Perfusion: No decreased activity in the left ventricle on stress imaging to suggest reversible ischemia. Slightly decreased uptake  along the apical inferior wall on both the rest and stress images. This could be related to some diaphragmatic attenuation. No large areas of infarct. Wall Motion: Normal left ventricular wall motion. No left ventricular dilation. Left Ventricular Ejection Fraction: 49 % End diastolic volume Q000111Q ml End systolic volume 74 ml IMPRESSION: 1. No reversible ischemia or infarction. 2. Normal left ventricular wall motion. 3. Left ventricular ejection fraction is 49%. 4. Non invasive risk stratification*: Intermediate *2012 Appropriate Use Criteria for Coronary Revascularization Focused Update: J Am Coll Cardiol. [id number]. http://content.airportbarriers.com.aspx?articleid=[email or web address] Electronically Signed   By: Markus Daft M.D.   On: 11/10/2016 13:41    Disposition   Pt is being discharged home today in good condition.  Follow-up Plans & Appointments    Follow-up Information    Nelva Bush, MD Follow up.   Specialty:  Cardiology Why:  Our office will call you for appointment.  Contact information: [address] [place] [address] [phone]          Discharge Instructions    Diet - low sodium heart healthy    Complete by:  As directed    Increase activity slowly    Complete by:  As directed       Discharge Medications   Current Discharge Medication List    START taking these medications   Details  nitroGLYCERIN (NITROSTAT) 0.4 MG SL tablet Place 1 tablet (0.4 mg total) under the tongue every 5 (five) minutes as needed for chest pain. Qty: 30 tablet, Refills: 12      CONTINUE these medications which have NOT CHANGED   Details  ibuprofen (ADVIL,MOTRIN) 800 MG tablet Take 1 tablet (800 mg total) by mouth 3 (three) times daily. Qty: 21 tablet, Refills: 0    cyclobenzaprine (FLEXERIL) 10 MG tablet Take 1 tablet (10 mg total) by mouth 2 (two) times daily as needed for muscle spasms. Qty: 20 tablet, Refills: 0    HYDROcodone-acetaminophen (NORCO/VICODIN)  5-325 MG per tablet Take 1-2 tablets by mouth every 4 (four) hours as needed for moderate pain. Qty: 20 tablet, Refills: 0    penicillin v potassium (VEETID) 500 MG tablet Take 1 tablet (500 mg total) by mouth 4 (four) times daily. Qty: 40 tablet, Refills: 0           Outstanding Labs/Studies   None  Duration of Discharge Encounter   Greater than 30 minutes including physician time.  Signed, Jory Sims NP 11/10/2016, 4:19 PM

## 2016-11-11 LAB — HEMOGLOBIN A1C
Hgb A1c MFr Bld: 5.8 % — ABNORMAL HIGH (ref 4.8–5.6)
Mean Plasma Glucose: 120 mg/dL

## 2016-11-11 LAB — LDL CHOLESTEROL, DIRECT: Direct LDL: 96 mg/dL (ref 0–99)

## 2016-12-08 ENCOUNTER — Encounter (HOSPITAL_BASED_OUTPATIENT_CLINIC_OR_DEPARTMENT_OTHER): Payer: Self-pay | Admitting: Emergency Medicine

## 2016-12-08 ENCOUNTER — Emergency Department (HOSPITAL_BASED_OUTPATIENT_CLINIC_OR_DEPARTMENT_OTHER): Payer: Worker's Compensation

## 2016-12-08 ENCOUNTER — Emergency Department (HOSPITAL_BASED_OUTPATIENT_CLINIC_OR_DEPARTMENT_OTHER)
Admission: EM | Admit: 2016-12-08 | Discharge: 2016-12-08 | Disposition: A | Discharge status: Home / Self Care | Payer: Worker's Compensation | Attending: Emergency Medicine | Admitting: Emergency Medicine

## 2016-12-08 DIAGNOSIS — Y929 Unspecified place or not applicable: Secondary | ICD-10-CM | POA: Insufficient documentation

## 2016-12-08 DIAGNOSIS — S46912A Strain of unspecified muscle, fascia and tendon at shoulder and upper arm level, left arm, initial encounter: Secondary | ICD-10-CM | POA: Insufficient documentation

## 2016-12-08 DIAGNOSIS — X500XXA Overexertion from strenuous movement or load, initial encounter: Secondary | ICD-10-CM | POA: Insufficient documentation

## 2016-12-08 DIAGNOSIS — Y990 Civilian activity done for income or pay: Secondary | ICD-10-CM | POA: Diagnosis not present

## 2016-12-08 DIAGNOSIS — F17210 Nicotine dependence, cigarettes, uncomplicated: Secondary | ICD-10-CM | POA: Diagnosis not present

## 2016-12-08 DIAGNOSIS — Y9389 Activity, other specified: Secondary | ICD-10-CM | POA: Diagnosis not present

## 2016-12-08 DIAGNOSIS — M19012 Primary osteoarthritis, left shoulder: Secondary | ICD-10-CM | POA: Insufficient documentation

## 2016-12-08 DIAGNOSIS — S4992XA Unspecified injury of left shoulder and upper arm, initial encounter: Secondary | ICD-10-CM | POA: Diagnosis present

## 2016-12-08 DIAGNOSIS — T148XXA Other injury of unspecified body region, initial encounter: Secondary | ICD-10-CM

## 2016-12-08 MED ORDER — KETOROLAC TROMETHAMINE 60 MG/2ML IM SOLN
60.0000 mg | Freq: Once | INTRAMUSCULAR | Status: AC
  Administered 2016-12-08: 60 mg via INTRAMUSCULAR
  Filled 2016-12-08: qty 2

## 2016-12-08 MED ORDER — METHOCARBAMOL 500 MG PO TABS
1000.0000 mg | ORAL_TABLET | Freq: Once | ORAL | Status: AC
  Administered 2016-12-08: 1000 mg via ORAL
  Filled 2016-12-08: qty 2

## 2016-12-08 MED ORDER — METHOCARBAMOL 500 MG PO TABS: 1000.0000 mg | ORAL_TABLET | Freq: Three times a day (TID) | ORAL | 0 refills | Status: DC | PRN

## 2016-12-08 NOTE — ED Provider Notes (Signed)
[facility] Provider Note   CSN: [id number] Arrival date & time: 12/08/16  1934  By signing my name below, I, Ephriam Jenkins, attest that this documentation has been prepared under the direction and in the presence of No att. providers found. Electronically signed, Ephriam Jenkins, ED Scribe. 12/08/16. 10:13 PM.  History   Chief Complaint Chief Complaint  Patient presents with  . Shoulder Injury    HPI HPI Comments: Hari Wiggington is a 50 y.o. male, with Hx of arthritis, shoulder pain bilaterally, who presents to the Emergency Department complaining of gradually worsening, left shoulder pain that radiates to her left shoulder s/p an injury that occurred today. 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 This is worsened throughout the day. He reports exacerbating pain when moving the shoulder and with palpation. In the room he also complains of cramping left upper back pain. He has not taken any medications prior to arrival.   The history is provided by the patient. No language interpreter was used.    History reviewed. No pertinent past medical history.  Patient Active Problem List   Diagnosis Date Noted  . Unstable angina ([place]) 11/10/2016  . SHOULDER PAIN, BILATERAL 08/09/2010  . VITAMIN D DEFICIENCY 12/13/2009  . GLUCOSE INTOLERANCE 12/13/2009  . DENTAL CARIES 12/13/2009  . BACK PAIN 12/13/2009  . LIVER PAIN 11/29/2009  . TOBACCO USER 10/31/2009  . DEPRESSION 10/31/2009  . ALLERGIC RHINITIS 10/31/2009  . GERD 10/31/2009    Past Surgical History:  Procedure Laterality Date  . APPENDECTOMY       Home Medications    Prior to Admission medications   Medication Sig Start Date End Date Taking? Authorizing Provider  HYDROcodone-acetaminophen (NORCO/VICODIN) 5-325 MG per tablet Take 1-2 tablets by mouth every 4 (four) hours as needed for moderate  pain. Patient not taking: Reported on 11/10/2016 03/01/14   Hazel Sams, PA-C  ibuprofen (ADVIL,MOTRIN) 800 MG tablet Take 1 tablet (800 mg total) by mouth 3 (three) times daily. 11/05/16   Domenic Moras, PA-C  methocarbamol (ROBAXIN) 500 MG tablet Take 2 tablets (1,000 mg total) by mouth every 8 (eight) hours as needed for muscle spasms. 12/08/16   Julianne Rice, MD  nitroGLYCERIN (NITROSTAT) 0.4 MG SL tablet Place 1 tablet (0.4 mg total) under the tongue every 5 (five) minutes as needed for chest pain. 11/10/16   Lendon Colonel, NP  penicillin v potassium (VEETID) 500 MG tablet Take 1 tablet (500 mg total) by mouth 4 (four) times daily. Patient not taking: Reported on 11/10/2016 03/01/14 03/08/14  Hazel Sams, PA-C    Family History History reviewed. No pertinent family history.  Social History Social History  Substance Use Topics  . Smoking status: Current Every Day Smoker    Packs/day: 0.50    Years: 30.00    Types: Cigarettes  . Smokeless tobacco: Never Used  . Alcohol use Yes     Allergies   Patient has no known allergies.   Review of Systems Review of Systems  Constitutional: Negative for chills and fever.  Respiratory: Negative for cough and shortness of breath.   Cardiovascular: Negative for chest pain, palpitations and leg swelling.  Gastrointestinal: Negative for abdominal pain, nausea and vomiting.  Genitourinary: Negative for dysuria and flank pain.  Musculoskeletal: Positive for arthralgias, myalgias and neck pain.  Skin: Negative for rash and wound.  Neurological: Negative for dizziness, weakness, light-headedness, numbness and headaches.  All other systems reviewed and are negative.    Physical Exam Updated Vital Signs BP 126/82 (BP Location: Right Arm)   Pulse 70   Temp 98.2 F (36.8 C) (Oral)   Resp 18   Ht 5\' 6"  (1.676 m)   Wt 195 lb (88.5 kg)   SpO2 99%   BMI 31.47 kg/m   Physical Exam  Constitutional: He is oriented to person, place, and  time. He appears well-developed and well-nourished. No distress.  HENT:  Head: Normocephalic and atraumatic.  Mouth/Throat: Oropharynx is clear and moist.  Eyes: EOM are normal. Pupils are equal, round, and reactive to light.  Neck: Normal range of motion. Neck supple.  Cardiovascular: Normal rate and regular rhythm.  Exam reveals no gallop and no friction rub.   No murmur heard. Pulmonary/Chest: Effort normal and breath sounds normal. He exhibits tenderness (left upper chest tenderness with palpation and tenderness to palpation over the superior sternum. No obvious swelling, Crepitance or deformity.).  Abdominal: Soft. Bowel sounds are normal. There is no tenderness. There is no rebound and no guarding.  Musculoskeletal: Normal range of motion. He exhibits tenderness. He exhibits no edema.  Patient with tenderness to palpation along the entire length of the apical and over the left deltoid and left trapezius. He has full range of motion of the left deltoid but states this exacerbates his pain. Patient has left sided thoracic muscular tenderness to palpation. 2+ distal pulses in all extremities.  Neurological: He is alert and oriented to person, place, and time.  5/5 motor and deltoid abduction and abduction. 5/5 grip strength bilaterally. Sensation is fully intact. Patient is ambulating without any difficulty.  Skin: Skin is warm and dry. Capillary refill takes less than 2 seconds. No rash noted. No erythema.  Psychiatric: He has a normal mood and affect. His behavior is normal.  Nursing note and vitals reviewed.    ED Treatments / Results  DIAGNOSTIC STUDIES: Oxygen Saturation is 100% on RA, normal by my interpretation.  COORDINATION OF CARE: 10:00 PM-Discussed treatment plan with pt at bedside and pt agreed to plan.   Labs (all labs ordered are listed, but only abnormal results are displayed) Labs Reviewed - No data to display  EKG  EKG Interpretation  Date/Time:  Saturday  December 08 2016 22:30:18 EST Ventricular Rate:  70 PR Interval:    QRS Duration: 93 QT Interval:  381 QTC Calculation: 412 R Axis:   76 Text Interpretation:  Sinus rhythm No significant change since last tracing Confirmed by LIU MD, DANA [phone]) on 12/09/2016 1:26:38 PM      Radiology Dg Shoulder Left  Result Date: 12/08/2016 CLINICAL DATA:  Left shoulder pain after lifting injury today. EXAM: LEFT SHOULDER - 2+ VIEW COMPARISON:  Radiographs of May 16, 2011. FINDINGS: There is no evidence of fracture or dislocation. Severe degenerative changes seen involving the left acromioclavicular joint. Soft tissues are unremarkable. IMPRESSION: Severe degenerative joint disease of the left acromioclavicular joint. No acute abnormality seen in the left shoulder. Electronically Signed   By: Marijo Conception, M.D.   On: 12/08/2016 20:25   Procedures Procedures (including critical care time)  Medications Ordered in ED Medications  ketorolac (TORADOL) injection 60 mg (60 mg Intramuscular Given 12/08/16 2224)  methocarbamol (ROBAXIN) tablet 1,000 mg (1,000 mg Oral Given 12/08/16 2223)     Initial Impression / Assessment and Plan / ED Course  I have reviewed the triage vital  signs and the nursing notes.  Pertinent labs & imaging results that were available during my care of the patient were reviewed by me and considered in my medical decision making (see chart for details).  Clinical Course    Symptoms appear musculoskeletal in origin. Osteoarthritis of the before meals joint on x-ray. We'll discharge home with muscle relaxant and advised to continue NSAIDs  Final Clinical Impressions(s) / ED Diagnoses   Final diagnoses:  Arthritis of left acromioclavicular joint  Muscle strain    New Prescriptions Discharge Medication List as of 12/08/2016 10:36 PM    START taking these medications   Details  methocarbamol (ROBAXIN) 500 MG tablet Take 2 tablets (1,000 mg total) by mouth every 8 (eight)  hours as needed for muscle spasms., Starting Sat 12/08/2016, Print      I personally performed the services described in this documentation, which was scribed in my presence. The recorded information has been reviewed and is accurate.       Julianne Rice, MD 12/09/16 1710

## 2016-12-08 NOTE — ED Triage Notes (Addendum)
Patient reports left neck and left shoulder pain after injury at work.  States he was pulling heavy items upstairs and has pain in his neck and shoulder since event occurred this morning.  Patient is worker's comp.

## 2017-09-09 ENCOUNTER — Emergency Department (HOSPITAL_BASED_OUTPATIENT_CLINIC_OR_DEPARTMENT_OTHER)
Admission: EM | Admit: 2017-09-09 | Discharge: 2017-09-09 | Disposition: A | Discharge status: Home / Self Care | Payer: Self-pay | Attending: Emergency Medicine | Admitting: Emergency Medicine

## 2017-09-09 ENCOUNTER — Encounter (HOSPITAL_BASED_OUTPATIENT_CLINIC_OR_DEPARTMENT_OTHER): Payer: Self-pay | Admitting: *Deleted

## 2017-09-09 DIAGNOSIS — L03012 Cellulitis of left finger: Secondary | ICD-10-CM | POA: Insufficient documentation

## 2017-09-09 DIAGNOSIS — F17210 Nicotine dependence, cigarettes, uncomplicated: Secondary | ICD-10-CM | POA: Insufficient documentation

## 2017-09-09 MED ORDER — LIDOCAINE HCL (PF) 1 % IJ SOLN
5.0000 mL | Freq: Once | INTRAMUSCULAR | Status: AC
  Administered 2017-09-09: 5 mL

## 2017-09-09 NOTE — ED Provider Notes (Signed)
[facility] Provider Note   CSN: [id number] Arrival date & time: 09/09/17  1326     History   Chief Complaint Chief Complaint  Patient presents with  . Finger Injury    HPI Preston Sullivan is a 51 y.o. male presenting to the ED with acute onset of left fourth finger pain began on Saturday. He states pain is worse with movement and palpation. Repots associated edema. States he was helping a family member move on Friday, however does not recall any injuries to this finger. He denies history of gout. Denies fever/chills, nausea/vomiting, or any other complaints.  The history is provided by the patient.    History reviewed. No pertinent past medical history.  Patient Active Problem List   Diagnosis Date Noted  . Unstable angina ([place]) 11/10/2016  . SHOULDER PAIN, BILATERAL 08/09/2010  . VITAMIN D DEFICIENCY 12/13/2009  . GLUCOSE INTOLERANCE 12/13/2009  . DENTAL CARIES 12/13/2009  . BACK PAIN 12/13/2009  . LIVER PAIN 11/29/2009  . TOBACCO USER 10/31/2009  . DEPRESSION 10/31/2009  . ALLERGIC RHINITIS 10/31/2009  . GERD 10/31/2009    Past Surgical History:  Procedure Laterality Date  . APPENDECTOMY         Home Medications    Prior to Admission medications   Not on File    Family History History reviewed. No pertinent family history.  Social History Social History  Substance Use Topics  . Smoking status: Current Every Day Smoker    Packs/day: 0.50    Years: 30.00    Types: Cigarettes  . Smokeless tobacco: Never Used  . Alcohol use Yes     Comment: soc     Allergies   Patient has no known allergies.   Review of Systems Review of Systems  Constitutional: Negative for chills and fever.  Gastrointestinal: Negative for nausea.  Musculoskeletal: Positive for arthralgias and joint swelling.     Physical Exam Updated Vital Signs BP 125/72   Pulse (!) 103   Temp 98.5 F (36.9 C) (Oral)   Resp 16   Ht 5\' 6"  (1.676 m)   Wt 93 kg (205 lb)    SpO2 100%   BMI 33.09 kg/m   Physical Exam  Constitutional: He appears well-developed and well-nourished.  HENT:  Head: Normocephalic and atraumatic.  Eyes: Conjunctivae are normal.  Cardiovascular: Intact distal pulses.   Pulmonary/Chest: Effort normal.  Musculoskeletal:  Left fourth finger with edema and erythema surrounding DIP and finger nail. Fluctuant and tender area noted over the lateral border of fingernail. Finger pad with some edema, mildly tender however not significantly firm. DIP with decreased range of motion secondary to pain and swelling. Intermediate and proximal joints of digit with normal range of motion.  Psychiatric: He has a normal mood and affect. His behavior is normal.  Nursing note and vitals reviewed.    ED Treatments / Results  Labs (all labs ordered are listed, but only abnormal results are displayed) Labs Reviewed - No data to display  EKG  EKG Interpretation None       Radiology No results found.  Procedures .Marland KitchenIncision and Drainage Date/Time: 09/09/2017 3:35 PM Performed by: RUSSO, [place] N Authorized by: RUSSO, [place] N   Consent:    Consent obtained:  Verbal   Consent given by:  Patient   Risks discussed:  Bleeding, infection, pain and incomplete drainage   Alternatives discussed:  Alternative treatment and no treatment Location:    Type:  Abscess   Size:  1cm  Location:  Upper extremity   Upper extremity location:  Finger   Finger location:  R ring finger (paronychia) Pre-procedure details:    Skin preparation:  Betadine Anesthesia (see MAR for exact dosages):    Anesthesia method:  Nerve block   Block needle gauge:  27 G   Block anesthetic:  Lidocaine 1% w/o epi   Block injection procedure:  Anatomic landmarks palpated   Block outcome:  Anesthesia achieved Procedure type:    Complexity:  Simple Procedure details:    Needle aspiration: no     Incision types:  Single straight   Incision depth:  Dermal   Scalpel  blade:  11   Wound management:  Irrigated with saline   Drainage:  Bloody and purulent   Drainage amount:  Moderate   Wound treatment:  Wound left open   Packing materials:  None Post-procedure details:    Patient tolerance of procedure:  Tolerated well, no immediate complications   (including critical care time)  Medications Ordered in ED Medications  lidocaine (PF) (XYLOCAINE) 1 % injection 5 mL (5 mLs Infiltration Given 09/09/17 1457)     Initial Impression / Assessment and Plan / ED Course  I have reviewed the triage vital signs and the nursing notes.  Pertinent labs & imaging results that were available during my care of the patient were reviewed by me and considered in my medical decision making (see chart for details).     Patient with paronychia to left fourth finger amenable to incision and drainage. Exam not consistent with felon. Abscess was not large enough to warrant packing or drain,  wound recheck in 2 days. Encouraged home warm soaks and flushing.  Mild signs of cellulitis is surrounding skin.  Will d/c to home.  No antibiotic therapy is indicated.  Patient discussed with and seen by Dr. Alvino Chapel  Discussed results, findings, treatment and follow up. Patient advised of return precautions. Patient verbalized understanding and agreed with plan.  Final Clinical Impressions(s) / ED Diagnoses   Final diagnoses:  Paronychia of left ring finger    New Prescriptions New Prescriptions   No medications on file     Russo, [place] N, PA-C 09/09/17 [place], Nathan, MD 09/10/17 [phone]

## 2017-09-09 NOTE — Discharge Instructions (Signed)
Please read instructions below.  Keep your wound clean and covered. Soak/flush your wound with warm water, multiple times per day. You can take Advil/ibuprofen every 6 hours as needed for pain. Follow up with your primary care or urgent care for wound recheck in 2 days.  Return to the ER for fever, worsening redness, or new or worsening symptoms.

## 2017-09-09 NOTE — ED Notes (Signed)
ED Provider at bedside. 

## 2017-09-09 NOTE — ED Triage Notes (Signed)
Pt c/o ring finger pain and swelling x 3 days

## 2017-10-27 ENCOUNTER — Other Ambulatory Visit: Payer: Self-pay

## 2017-10-27 ENCOUNTER — Emergency Department (HOSPITAL_COMMUNITY)
Admission: EM | Admit: 2017-10-27 | Discharge: 2017-10-27 | Disposition: A | Discharge status: Home / Self Care | Payer: Self-pay | Attending: Emergency Medicine | Admitting: Emergency Medicine

## 2017-10-27 ENCOUNTER — Emergency Department (HOSPITAL_COMMUNITY): Payer: Self-pay

## 2017-10-27 ENCOUNTER — Encounter (HOSPITAL_COMMUNITY): Payer: Self-pay

## 2017-10-27 DIAGNOSIS — M545 Low back pain, unspecified: Secondary | ICD-10-CM

## 2017-10-27 DIAGNOSIS — Z79899 Other long term (current) drug therapy: Secondary | ICD-10-CM | POA: Insufficient documentation

## 2017-10-27 DIAGNOSIS — J209 Acute bronchitis, unspecified: Secondary | ICD-10-CM

## 2017-10-27 DIAGNOSIS — Z720 Tobacco use: Secondary | ICD-10-CM

## 2017-10-27 DIAGNOSIS — F17210 Nicotine dependence, cigarettes, uncomplicated: Secondary | ICD-10-CM | POA: Insufficient documentation

## 2017-10-27 LAB — URINALYSIS, ROUTINE W REFLEX MICROSCOPIC
Bilirubin Urine: NEGATIVE
Glucose, UA: NEGATIVE mg/dL
Hgb urine dipstick: NEGATIVE
Ketones, ur: NEGATIVE mg/dL
Leukocytes, UA: NEGATIVE
Nitrite: NEGATIVE
Protein, ur: NEGATIVE mg/dL
Specific Gravity, Urine: 1.025 (ref 1.005–1.030)
pH: 6.5 (ref 5.0–8.0)

## 2017-10-27 MED ORDER — DOXYCYCLINE HYCLATE 100 MG PO CAPS: 100.0000 mg | ORAL_CAPSULE | Freq: Two times a day (BID) | ORAL | 0 refills | Status: DC

## 2017-10-27 NOTE — Discharge Instructions (Signed)
The pain in your back is likely from muscle strain.  Try to avoid lifting, as much as possible.  Additionally for the pain, use heat on the area that hurts several times a day and take Tylenol for pain.  You likely have bronchitis, contributing to your cough and sputum production.  It is important to stop smoking to help your lungs heal and prevent recurrences of this disorder.  The best way to stop smoking is immediate cessation.

## 2017-10-27 NOTE — ED Triage Notes (Signed)
He c/o right flank/thoracic area pain which began yesterday while at work. This pain is not associated with injury/trauma. He also states this pain is accentuated by cough.

## 2017-10-27 NOTE — ED Provider Notes (Signed)
[facility] DEPT Provider Note   CSN: [id number] Arrival date & time: 10/27/17  2725     History   Chief Complaint Chief Complaint  Patient presents with  . Flank Pain    HPI Preston Sullivan is a 51 y.o. male. He presents for evaluation of left lower back pain, present for 3 days, worse with cough, or movement.  No known trauma.  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  He denies urinary symptoms.  He has had a productive cough, with some green sputum recently.  He smokes.  He denies fever, chills, nausea, vomiting, weakness or dizziness.  No prior similar problems.  There are no other known modifying factors.  HPI  History reviewed. No pertinent past medical history.  Patient Active Problem List   Diagnosis Date Noted  . Unstable angina ([place]) 11/10/2016  . SHOULDER PAIN, BILATERAL 08/09/2010  . VITAMIN D DEFICIENCY 12/13/2009  . GLUCOSE INTOLERANCE 12/13/2009  . DENTAL CARIES 12/13/2009  . BACK PAIN 12/13/2009  . LIVER PAIN 11/29/2009  . TOBACCO USER 10/31/2009  . DEPRESSION 10/31/2009  . ALLERGIC RHINITIS 10/31/2009  . GERD 10/31/2009    Past Surgical History:  Procedure Laterality Date  . APPENDECTOMY         Home Medications    Prior to Admission medications   Medication Sig Start Date End Date Taking? Authorizing Provider  ibuprofen (ADVIL,MOTRIN) 200 MG tablet Take 600 mg every 8 (eight) hours as needed by mouth.   Yes [provider]  Multiple Vitamin (MULTIVITAMIN) tablet Take 1 tablet daily by mouth.   Yes [provider]  doxycycline (VIBRAMYCIN) 100 MG capsule Take 1 capsule (100 mg total) 2 (two) times daily by mouth. One po bid x 7 days 10/27/17   Daleen Bo, MD    Family History No family history on file.  Social History Social History   Tobacco Use  . Smoking status: Current Every Day Smoker    Packs/day: 0.50    Years: 30.00    Pack years: 15.00    Types: Cigarettes  . Smokeless tobacco: Never Used  Substance Use Topics  . Alcohol use: Yes    Comment: soc  . Drug use: Yes    Types: Marijuana     Allergies   Patient has no known allergies.   Review of Systems Review of Systems  All other systems reviewed and are negative.    Physical Exam Updated Vital Signs BP 125/80   Pulse 67   Temp 97.8 F (36.6 C) (Oral)   Resp 18   SpO2 100%   Physical Exam  Constitutional: He is oriented to person, place, and time. He appears well-developed and well-nourished. No distress.  HENT:  Head: Normocephalic and atraumatic.  Right Ear: External ear normal.  Left Ear: External ear normal.  Eyes: Conjunctivae and EOM are normal. Pupils are equal, round, and reactive to light.  Neck: Normal range of motion and phonation normal. Neck supple.  Cardiovascular: Normal rate, regular rhythm and normal heart sounds.  Pulmonary/Chest: Effort normal and breath sounds normal. He exhibits no bony tenderness.  Abdominal: Soft. There is no tenderness.  Musculoskeletal: Normal range of motion.  Decreased back motion secondary to lumbar discomfort, going from supine to sitting on the stretcher.  No tenderness to palpation of thoracic or lumbar spines or paravertebral regions.  Neurological: He is alert and oriented to person, place, and time. No cranial nerve  deficit or sensory deficit. He exhibits normal muscle tone. Coordination normal.  Skin: Skin is warm, dry and intact.  Psychiatric: He has a normal mood and affect. His behavior is normal. Judgment and thought content normal.  Nursing note and vitals reviewed.    ED Treatments / Results  Labs (all labs ordered are listed, but only abnormal results are displayed) Labs Reviewed  URINALYSIS, ROUTINE W REFLEX MICROSCOPIC    EKG  EKG Interpretation None       Radiology Dg Chest 2 View  Result Date: 10/27/2017 CLINICAL DATA:  Cough, right flank pain. EXAM:  CHEST  2 VIEW COMPARISON:  11/09/2016 FINDINGS: Mild peribronchial thickening. Heart and mediastinal contours are within normal limits. No focal opacities or effusions. No acute bony abnormality. IMPRESSION: Mild bronchitic changes. Electronically Signed   By: Rolm Baptise M.D.   On: 10/27/2017 10:07   Dg Lumbar Spine Complete  Result Date: 10/27/2017 CLINICAL DATA:  Pain EXAM: LUMBAR SPINE - COMPLETE 4+ VIEW COMPARISON:  05/07/2009 FINDINGS: There is no evidence of lumbar spine fracture. Alignment is normal. Intervertebral disc spaces are maintained. IMPRESSION: Negative. Electronically Signed   By: Rolm Baptise M.D.   On: 10/27/2017 10:08    Procedures Procedures (including critical care time)  Medications Ordered in ED Medications - No data to display   Initial Impression / Assessment and Plan / ED Course  I have reviewed the triage vital signs and the nursing notes.  Pertinent labs & imaging results that were available during my care of the patient were reviewed by me and considered in my medical decision making (see chart for details).  Clinical Course as of Oct 27 1140  Sun Oct 27, 2017  1054 At this time the patient remains comfortable.  His wife asked that his urine be checked, even though he is continuing to die urinary tract symptoms.  Urinalysis requested.  [EW]    Clinical Course User Index [EW] Daleen Bo, MD     Patient Vitals for the past 24 hrs:  BP Temp Temp src Pulse Resp SpO2  10/27/17 0916 125/80 97.8 F (36.6 C) Oral 67 18 100 %    11:40 AM Reevaluation with update and discussion. After initial assessment and treatment, an updated evaluation reveals he remains comfortable, findings discussed with patient and wife, all questions answered. Daleen Bo    Final Clinical Impressions(s) / ED Diagnoses   Final diagnoses:  Acute left-sided low back pain without sciatica  Acute bronchitis, unspecified organism  Tobacco abuse    Nonspecific low back  pain, likely related to muscle strain, doubt fracture, lumbar myelopathy or lumbar radiculopathy.  No associated cough with sputum production, and a smoker.  Patient desires treatment with antibiotics for bronchitis.  Patient counseled verbally by me about smoking cessation.  Nursing Notes Reviewed/ Care Coordinated Applicable Imaging Reviewed Interpretation of Laboratory Data incorporated into ED treatment  The patient appears reasonably screened and/or stabilized for discharge and I doubt any other medical condition or other [facility] requiring further screening, evaluation, or treatment in the ED at this time prior to discharge.  Plan: Home Medications-APAP for pain; Home Treatments-heat for muscle pain, and recommended to stop smoking as soon as possible.,  Work release, until 10/30/17; return here if the recommended treatment, does not improve the symptoms; Recommended follow up-PCP, as needed   ED Discharge Orders        Ordered    doxycycline (VIBRAMYCIN) 100 MG capsule  2 times daily     10/27/17  Tushka Chena Chohan, MD 10/27/17 [phone]

## 2017-11-11 ENCOUNTER — Emergency Department (HOSPITAL_COMMUNITY): Payer: Self-pay

## 2017-11-11 ENCOUNTER — Emergency Department (HOSPITAL_COMMUNITY)
Admission: EM | Admit: 2017-11-11 | Discharge: 2017-11-11 | Disposition: A | Discharge status: Home / Self Care | Payer: Self-pay | Attending: Emergency Medicine | Admitting: Emergency Medicine

## 2017-11-11 ENCOUNTER — Encounter (HOSPITAL_COMMUNITY): Payer: Self-pay | Admitting: Emergency Medicine

## 2017-11-11 ENCOUNTER — Other Ambulatory Visit: Payer: Self-pay

## 2017-11-11 DIAGNOSIS — F419 Anxiety disorder, unspecified: Secondary | ICD-10-CM | POA: Insufficient documentation

## 2017-11-11 DIAGNOSIS — J40 Bronchitis, not specified as acute or chronic: Secondary | ICD-10-CM

## 2017-11-11 DIAGNOSIS — Z7722 Contact with and (suspected) exposure to environmental tobacco smoke (acute) (chronic): Secondary | ICD-10-CM | POA: Insufficient documentation

## 2017-11-11 DIAGNOSIS — J209 Acute bronchitis, unspecified: Secondary | ICD-10-CM | POA: Insufficient documentation

## 2017-11-11 DIAGNOSIS — Z79899 Other long term (current) drug therapy: Secondary | ICD-10-CM | POA: Insufficient documentation

## 2017-11-11 DIAGNOSIS — R0602 Shortness of breath: Secondary | ICD-10-CM

## 2017-11-11 LAB — CBC WITH DIFFERENTIAL/PLATELET
Basophils Absolute: 0 10*3/uL (ref 0.0–0.1)
Basophils Relative: 0 %
Eosinophils Absolute: 0 10*3/uL (ref 0.0–0.7)
Eosinophils Relative: 1 %
HCT: 41.4 % (ref 39.0–52.0)
Hemoglobin: 13.9 g/dL (ref 13.0–17.0)
Lymphocytes Relative: 21 %
Lymphs Abs: 1.4 10*3/uL (ref 0.7–4.0)
MCH: 30.1 pg (ref 26.0–34.0)
MCHC: 33.6 g/dL (ref 30.0–36.0)
MCV: 89.6 fL (ref 78.0–100.0)
Monocytes Absolute: 0.6 10*3/uL (ref 0.1–1.0)
Monocytes Relative: 8 %
Neutro Abs: 4.9 10*3/uL (ref 1.7–7.7)
Neutrophils Relative %: 70 %
Platelets: 214 10*3/uL (ref 150–400)
RBC: 4.62 MIL/uL (ref 4.22–5.81)
RDW: 14.2 % (ref 11.5–15.5)
WBC: 6.9 10*3/uL (ref 4.0–10.5)

## 2017-11-11 LAB — COMPREHENSIVE METABOLIC PANEL
ALT: 31 U/L (ref 17–63)
AST: 22 U/L (ref 15–41)
Albumin: 4 g/dL (ref 3.5–5.0)
Alkaline Phosphatase: 76 U/L (ref 38–126)
Anion gap: 6 (ref 5–15)
BUN: 15 mg/dL (ref 6–20)
CO2: 23 mmol/L (ref 22–32)
Calcium: 8.7 mg/dL — ABNORMAL LOW (ref 8.9–10.3)
Chloride: 109 mmol/L (ref 101–111)
Creatinine, Ser: 0.67 mg/dL (ref 0.61–1.24)
GFR calc Af Amer: >60 mL/min (ref >60)
GFR calc non Af Amer: >60 mL/min (ref >60)
Glucose, Bld: 101 mg/dL — ABNORMAL HIGH (ref 65–99)
Potassium: 4.1 mmol/L (ref 3.5–5.1)
Sodium: 138 mmol/L (ref 135–145)
Total Bilirubin: 0.6 mg/dL (ref 0.3–1.2)
Total Protein: 7.3 g/dL (ref 6.5–8.1)

## 2017-11-11 LAB — I-STAT TROPONIN, ED
Troponin i, poc: 0.01 ng/mL (ref 0.00–0.08)
Troponin i, poc: 0 ng/mL (ref 0.00–0.08)

## 2017-11-11 LAB — D-DIMER, QUANTITATIVE: D-Dimer, Quant: 1.5 ug/mL-FEU — ABNORMAL HIGH (ref 0.00–0.50)

## 2017-11-11 MED ORDER — BENZONATATE 100 MG PO CAPS: 100.0000 mg | ORAL_CAPSULE | Freq: Three times a day (TID) | ORAL | 0 refills | Status: DC

## 2017-11-11 MED ORDER — IOPAMIDOL (ISOVUE-370) INJECTION 76%
100.0000 mL | Freq: Once | INTRAVENOUS | Status: AC | PRN
  Administered 2017-11-11: 100 mL via INTRAVENOUS

## 2017-11-11 MED ORDER — PREDNISONE 10 MG (21) PO TBPK: ORAL_TABLET | ORAL | 0 refills | Status: DC

## 2017-11-11 MED ORDER — SODIUM CHLORIDE 0.9 % IV BOLUS (SEPSIS)
1000.0000 mL | Freq: Once | INTRAVENOUS | Status: AC
  Administered 2017-11-11: 1000 mL via INTRAVENOUS

## 2017-11-11 MED ORDER — IOPAMIDOL (ISOVUE-370) INJECTION 76%
INTRAVENOUS | Status: AC
  Filled 2017-11-11: qty 100

## 2017-11-11 MED ORDER — ALBUTEROL SULFATE HFA 108 (90 BASE) MCG/ACT IN AERS: 2.0000 | INHALATION_SPRAY | RESPIRATORY_TRACT | 0 refills | Status: DC | PRN

## 2017-11-11 NOTE — ED Provider Notes (Signed)
[place] DEPT Provider Note   CSN: [id number] Arrival date & time: 11/11/17  0277     History   Chief Complaint Chief Complaint  Patient presents with  . Anxiety    HPI Preston Sullivan is a 51 y.o. male.  HPI   Preston Sullivan is a 51 y.o. male, with a history of stable angina, presenting to the ED with shortness of breath.  Patient states he had 2 episodes of shortness of breath recently that worried him.  The first episode was 3 days ago while the patient was performing heavy lifting and exerting himself at work.  Patient began to feel short of breath with chest tightness, stop to rest, and symptoms resolved after about 20-minute duration. The second episode was this morning while exerting himself at work.  He states this episode did not resolve with rest.  He took 1 NTG tablet prior to EMS arrival with no change in his symptoms.  His symptoms resolved with administration of albuterol nebulizer by EMS.  Episodes were accompanied by nausea and increased coughing.  He notes he has had a productive cough that seems to be worsening over the last month. Was seen for productive cough and muscle pain on 11/18, diagnosed with bronchitis, prescribed 7 days of doxycycline, which he finished.  Endorses some diarrhea during the course of antibiotics, but this has since resolved. Denies chest pain/upper back pain, vomiting/diarrhea, hemoptysis, abdominal pain, syncope, fever/chills, peripheral edema or leg pain, or any other complaints.  Denies history of PE/DVT, recent trauma, recent surgery, immobilization, cancer, estrogen use.  He adds that he will be able to have PCP follow-up after the first of the year when his health insurance becomes effective.  History reviewed. No pertinent past medical history.  Patient Active Problem List   Diagnosis Date Noted  . Unstable angina ([place]) 11/10/2016  . SHOULDER PAIN, BILATERAL 08/09/2010  . VITAMIN D DEFICIENCY  12/13/2009  . GLUCOSE INTOLERANCE 12/13/2009  . DENTAL CARIES 12/13/2009  . BACK PAIN 12/13/2009  . LIVER PAIN 11/29/2009  . TOBACCO USER 10/31/2009  . DEPRESSION 10/31/2009  . ALLERGIC RHINITIS 10/31/2009  . GERD 10/31/2009    Past Surgical History:  Procedure Laterality Date  . APPENDECTOMY         Home Medications    Prior to Admission medications   Medication Sig Start Date End Date Taking? Authorizing Provider  acetaminophen (TYLENOL) 500 MG tablet Take 1,000 mg by mouth every 6 (six) hours as needed for moderate pain.   Yes [provider]  Multiple Vitamin (MULTIVITAMIN) tablet Take 1 tablet daily by mouth.   Yes [provider]  albuterol (PROVENTIL HFA;VENTOLIN HFA) 108 (90 Base) MCG/ACT inhaler Inhale 2 puffs into the lungs every 4 (four) hours as needed for wheezing or shortness of breath. 11/11/17   Jarry Manon C, PA-C  benzonatate (TESSALON) 100 MG capsule Take 1 capsule (100 mg total) by mouth every 8 (eight) hours. 11/11/17   Dystany Duffy C, PA-C  predniSONE (STERAPRED UNI-PAK 21 TAB) 10 MG (21) TBPK tablet Take 6 tabs (60mg ) on day 1, 5 tabs (50mg ) on day 2, 4 tabs (40mg ) on day 3, 3 tabs (30mg ) on day 4, 2 tabs (20mg ) on day 5, and 1 tab (10mg ) on day 6. 11/11/17   Muranda Coye, Helane Gunther, PA-C    Family History History reviewed. No pertinent family history.  Social History Social History   Tobacco Use  . Smoking status: Current Every Day Smoker  Packs/day: 0.50    Years: 30.00    Pack years: 15.00    Types: Cigarettes  . Smokeless tobacco: Never Used  Substance Use Topics  . Alcohol use: Yes    Comment: soc  . Drug use: Yes    Types: Marijuana     Allergies   Patient has no known allergies.   Review of Systems Review of Systems  Constitutional: Negative for chills, diaphoresis and fever.  Respiratory: Positive for cough and shortness of breath.   Cardiovascular: Negative for palpitations and leg swelling.  Gastrointestinal: Positive for  nausea. Negative for abdominal pain, diarrhea and vomiting.  All other systems reviewed and are negative.    Physical Exam Updated Vital Signs BP 107/66 (BP Location: Right Arm)   Pulse 67   Temp 97.9 F (36.6 C) (Oral)   Resp 18   Ht 5\' 7"  (1.702 m)   Wt 93 kg (205 lb)   SpO2 100%   BMI 32.11 kg/m   Physical Exam  Constitutional: He appears well-developed and well-nourished. No distress.  HENT:  Head: Normocephalic and atraumatic.  Eyes: Conjunctivae are normal.  Neck: Neck supple.  Cardiovascular: Normal rate, regular rhythm, normal heart sounds and intact distal pulses.  Pulmonary/Chest: Effort normal and breath sounds normal. No respiratory distress.  No increased work of breathing.  Patient speaks in full sentences without difficulty.  Abdominal: Soft. There is no tenderness. There is no guarding.  Musculoskeletal: He exhibits no edema or tenderness.  No noted lower extremity edema, erythema, increased warmth, or tenderness.  Lymphadenopathy:    He has no cervical adenopathy.  Neurological: He is alert.  Skin: Skin is warm and dry. Capillary refill takes less than 2 seconds. He is not diaphoretic.  Psychiatric: He has a normal mood and affect. His behavior is normal.  Nursing note and vitals reviewed.    ED Treatments / Results  Labs (all labs ordered are listed, but only abnormal results are displayed) Labs Reviewed  COMPREHENSIVE METABOLIC PANEL - Abnormal; Notable for the following components:      Result Value   Glucose, Bld 101 (*)    Calcium 8.7 (*)    All other components within normal limits  D-DIMER, QUANTITATIVE (NOT AT [facility]) - Abnormal; Notable for the following components:   D-Dimer, Quant 1.50 (*)    All other components within normal limits  CBC WITH DIFFERENTIAL/PLATELET  I-STAT TROPONIN, ED  I-STAT TROPONIN, ED    EKG  EKG Interpretation None       Radiology Dg Chest 2 View  Result Date: 11/11/2017 CLINICAL DATA:  Chest pain,  shortness of breath. EXAM: CHEST  2 VIEW COMPARISON:  Radiographs of October 27, 2017. FINDINGS: The heart size and mediastinal contours are within normal limits. Both lungs are clear. No pneumothorax or pleural effusion is noted. The visualized skeletal structures are unremarkable. IMPRESSION: No active cardiopulmonary disease. Electronically Signed   By: Marijo Conception, M.D.   On: 11/11/2017 08:19   Ct Angio Chest Pe W And/or Wo Contrast  Result Date: 11/11/2017 CLINICAL DATA:  Increased shortness of Breath EXAM: CT ANGIOGRAPHY CHEST WITH CONTRAST TECHNIQUE: Multidetector CT imaging of the chest was performed using the standard protocol during bolus administration of intravenous contrast. Multiplanar CT image reconstructions and MIPs were obtained to evaluate the vascular anatomy. CONTRAST:  138mL ISOVUE-370 IOPAMIDOL (ISOVUE-370) INJECTION 76% COMPARISON:  Plain film from earlier in the same day. FINDINGS: Cardiovascular: Thoracic aorta is within normal limits. Mild coronary calcifications are seen.  Heart is within normal limits. The pulmonary artery shows no filling defect to suggest pulmonary embolism. Mediastinum/Nodes: The thoracic inlet is within normal limits. The esophagus is unremarkable. Multiple scattered small mediastinal and hilar lymph nodes are noted. The most prominent of these lie in the subcarinal region measuring approximately 18 mm in short axis. The majority of these measure less than 1 cm in short axis. Lungs/Pleura: The lungs are well aerated bilaterally. Mild emphysematous changes are seen. No sizable parenchymal nodules are. Very mild dependent atelectatic changes are seen without focal confluent infiltrate. Mild bronchial thickening is noted particularly in the lower lobes likely related to some bronchitis. A few small calcified granulomas noted Upper Abdomen: Visualized upper abdomen is within normal limits. Musculoskeletal: Degenerative changes of the thoracic spine are noted. No  specific acute bony abnormality is noted. Review of the MIP images confirms the above findings. IMPRESSION: No evidence of pulmonary emboli. Changes consistent with prior granulomatous disease. Mild bronchial thickening and bibasilar atelectatic changes predominately within the lower lobes. This is likely postinflammatory in nature. Scattered predominately small mediastinal and hilar lymph nodes as described. These are likely reactive in nature given the changes apparent inflammatory changes and may be in part due to prior granulomatous disease. Emphysema (ICD10-J43.9). Electronically Signed   By: Inez Catalina M.D.   On: 11/11/2017 10:23    Procedures Procedures (including critical care time)  Medications Ordered in ED Medications  iopamidol (ISOVUE-370) 76 % injection (not administered)  sodium chloride 0.9 % bolus 1,000 mL (1,000 mLs Intravenous New Bag/Given 11/11/17 0814)  iopamidol (ISOVUE-370) 76 % injection 100 mL (100 mLs Intravenous Contrast Given 11/11/17 1007)     Initial Impression / Assessment and Plan / ED Course  I have reviewed the triage vital signs and the nursing notes.  Pertinent labs & imaging results that were available during my care of the patient were reviewed by me and considered in my medical decision making (see chart for details).  Clinical Course as of Nov 12 1131  Mon Nov 11, 2017  1610 Discussed lab results with patient as well as the plan for CTA.  Continues to deny current shortness of breath.  Patient agrees to plan.  [SJ]  9604 Discussed imaging results with patient. Counseled on smoking cessation. Continues to deny shortness of breath.   [SJ]    Clinical Course User Index [SJ] Milea Klink C, PA-C    Patient presents with 2 episodes of shortness of breath.  No acute distress on exam.  Elevated d-dimer, delta troponins negative.  CTA without evidence of PE, but with evidence of bronchitis and emphysema.  Patient counseled on smoking cessation.  Ambulated  without distress while maintaining excellent SPO2. PCP follow-up.  Resources given. The patient was given instructions for home care as well as return precautions. Patient voices understanding of these instructions, accepts the plan, and is comfortable with discharge.    Vitals:   11/11/17 0930 11/11/17 1100 11/11/17 1115 11/11/17 1130  BP: (!) 111/55 130/70  135/77  Pulse: 65 64 63 65  Resp: 17 18 (!) 23 (!) 21  Temp:      TempSrc:      SpO2: 99% 100% 99% 99%  Weight:      Height:         Final Clinical Impressions(s) / ED Diagnoses   Final diagnoses:  Shortness of breath  Bronchitis    ED Discharge Orders        Ordered    predniSONE (STERAPRED UNI-PAK  21 TAB) 10 MG (21) TBPK tablet     11/11/17 1129    benzonatate (TESSALON) 100 MG capsule  Every 8 hours     11/11/17 1129    albuterol (PROVENTIL HFA;VENTOLIN HFA) 108 (90 Base) MCG/ACT inhaler  Every 4 hours PRN     11/11/17 1129       Lorayne Bender, PA-C 11/11/17 1137    Duffy Bruce, MD 11/11/17 1410

## 2017-11-11 NOTE — ED Triage Notes (Signed)
Patient complaining of anxiety. He was having chest pain and sob when EMS arrive. Patient was given a neb treatment but was feeling better before finishing neb and wanted to stop per EMS.

## 2017-11-11 NOTE — ED Notes (Signed)
Pt ambulated over 30 feet.  Pt's pulse remained around  87% and O2's remained around 98%.  Pt did not show signs of distress.  Pt had a steady gait.

## 2017-11-11 NOTE — Discharge Instructions (Addendum)
There is evidence of inflammation in the lungs as well as early emphysema.  It is very important that you work to stop smoking.  Hand washing: Wash your hands throughout the day, but especially before and after touching the face, using the restroom, sneezing, coughing, or touching surfaces that have been coughed or sneezed upon. Hydration: Symptoms will be intensified and complicated by dehydration. Dehydration can also extend the duration of symptoms. Drink plenty of fluids and get plenty of rest. You should be drinking at least half a liter of water an hour to stay hydrated. Electrolyte drinks are also encouraged. You should be drinking enough fluids to make your urine light yellow, almost clear. If this is not the case, you are not drinking enough water. Please note that some of the treatments indicated below will not be effective if you are not adequately hydrated. Pain or fever: Ibuprofen, Naproxen, or Tylenol for pain or fever.  Prednisone: Take the prednisone, as prescribed, until done. Albuterol: May use the albuterol, as needed for episodes of shortness of breath. Cough: Use the Tessalon for cough.  Congestion: Plain Mucinex may help relieve congestion. Saline sinus rinses and saline nasal sprays may also help relieve congestion. If you do not have heart problems or an allergy to such medications, you may also try phenylephrine or Sudafed. Follow up: Follow up with a primary care provider, as needed, for any future management of this issue. Return: Return to the ED for any worsening symptoms.

## 2017-11-11 NOTE — ED Notes (Signed)
Patient transported to X-ray 

## 2017-12-23 ENCOUNTER — Ambulatory Visit (INDEPENDENT_AMBULATORY_CARE_PROVIDER_SITE_OTHER): Payer: BLUE CROSS/BLUE SHIELD | Admitting: Family Medicine

## 2017-12-23 ENCOUNTER — Other Ambulatory Visit: Payer: Self-pay

## 2017-12-23 ENCOUNTER — Encounter: Payer: Self-pay | Admitting: Family Medicine

## 2017-12-23 VITALS — BP 102/60 | HR 89 | Temp 98.3°F | Ht 67.0 in | Wt 194.4 lb

## 2017-12-23 DIAGNOSIS — E559 Vitamin D deficiency, unspecified: Secondary | ICD-10-CM

## 2017-12-23 DIAGNOSIS — F17200 Nicotine dependence, unspecified, uncomplicated: Secondary | ICD-10-CM

## 2017-12-23 DIAGNOSIS — J438 Other emphysema: Secondary | ICD-10-CM

## 2017-12-23 MED ORDER — NICOTINE 14 MG/24HR TD PT24: 14.0000 mg | MEDICATED_PATCH | Freq: Every day | TRANSDERMAL | 2 refills | Status: DC

## 2017-12-23 MED ORDER — VARENICLINE TARTRATE 0.5 MG PO TABS: 0.5000 mg | ORAL_TABLET | Freq: Two times a day (BID) | ORAL | 10 refills | Status: DC

## 2017-12-23 NOTE — Patient Instructions (Addendum)
It was great to meet you today! Thank you for letting me participate in your care!  Today, we discussed your overall health. Overall, I am glad you have decided to quit smoking. I am starting you on Chantix today. Please start by taking ONE tablet per DAY for the first 3 days. After that, please take TWO tablets per day. If you start to have any depression or suicidal thought stop the medication immediately and call 911 for help.   I have also started you on the Nicotine patch to use once per day.  I will have you return to the clinic for pulmonary function testing to see if you need additional medications for your possible COPD.  I am checking your vitamin D levels today due to your history of vit D deficiency.  We will consider getting further allergy testing if your episodes of SOB continue. If you get throat swelling immediately seek emergency medical help. Please keep an allergy diary going forward.  Be well, Harolyn Rutherford, DO PGY-1, [facility]

## 2017-12-23 NOTE — Progress Notes (Signed)
Subjective: Chief Complaint  Patient presents with  . Establish Care     HPI: Preston [facility] is a 52 y.o. presenting to clinic today to discuss the following:  1 Establish care 2 Discuss quitting smoking  Mr Preston [facility] is a 52y/o male with a PMH of tobacco use disorder and bronchitis, who recently seen and evaluated in the [facility] ED due to SOB and feeling like he was going to pass out. He did not ever actually lose consciousness. He also reports feeling "itchy" all over and that his lips felt like they became swollen. He denies any throat closing or swelling. He has been a smoker for 40+ years and currently smokes about 1/2ppd. He states his recent episodes of SOB with feeling like he will faint is scary to him and he is motivated to quit smoking.  He denies any other known chronic health issues such as HTN or diabetes. His recent work up in the ED showed an HgbA1c of 5.8 and his blood pressure remained below 140/90. It was below goal on his office visit today.  He was prescribed an albuterol inhaler from the ED and has only needed it once since his ED visit about one month ago.  Review of Systems  Constitutional: Negative for chills, fever, malaise/fatigue and weight loss.  HENT: Positive for congestion. Negative for ear pain, hearing loss, sinus pain and sore throat.   Respiratory: Positive for cough. Negative for sputum production, shortness of breath and wheezing.   Cardiovascular: Negative for chest pain, palpitations, orthopnea and leg swelling.  Gastrointestinal: Negative for abdominal pain, constipation, diarrhea, nausea and vomiting.  Genitourinary: Positive for frequency. Negative for dysuria and urgency.  Musculoskeletal: Negative for joint pain and myalgias.  Skin: Negative for itching and rash.  Neurological: Negative for loss of consciousness and weakness.  Psychiatric/Behavioral: Negative for depression and memory loss.   Health Maintenance: declined flu  shot today  ROS noted in HPI.   Past Medical, Surgical, Social, and Family History Reviewed & Updated per EMR.   Pertinent Historical Findings include:   Social History   Tobacco Use  Smoking Status Current Every Day Smoker  . Packs/day: 0.50  . Years: 30.00  . Pack years: 15.00  . Types: Cigarettes  Smokeless Tobacco Never Used      Objective: BP 102/60   Pulse 89   Temp 98.3 F (36.8 C) (Oral)   Ht _0  (1.702 m)   Wt 194 lb 6.4 oz (88.2 kg)   SpO2 98%   BMI 30.45 kg/m  Vitals and nursing notes reviewed  Physical Exam  Constitutional: He is oriented to person, place, and time and well-developed, well-nourished, and in no distress. No distress.  HENT:  Head: Normocephalic and atraumatic.  Right Ear: External ear normal.  Left Ear: External ear normal.  Mouth/Throat: Oropharynx is clear and moist. No oropharyngeal exudate.  Boggy turbinates  Eyes: Conjunctivae and EOM are normal. Pupils are equal, round, and reactive to light. No scleral icterus.  Neck: Normal range of motion. Neck supple. No tracheal deviation present. No thyromegaly present.  Cardiovascular: Normal rate, regular rhythm, normal heart sounds and intact distal pulses.  No murmur heard. Pulmonary/Chest: Effort normal and breath sounds normal. He has no wheezes. He has no rales.  Abdominal: Soft. Bowel sounds are normal. He exhibits no distension. There is no tenderness. There is no rebound and no guarding.  Musculoskeletal: Normal range of motion. He exhibits no edema.  Lymphadenopathy:  He has no cervical adenopathy.  Neurological: He is alert and oriented to person, place, and time.  Skin: Skin is warm and dry. No rash noted. No erythema.  Psychiatric: Mood normal.     Results for orders placed or performed in visit on 12/23/17 (from the past 72 hour(s))  Vitamin D, 25-hydroxy     Status: Abnormal   Collection Time: 12/23/17 12:32 PM  Result Value Ref Range   Vit D, 25-Hydroxy 4.3 (L)  30.0 - 100.0 ng/mL    Comment: Vitamin D deficiency has been defined by the [place] practice guideline as a level of serum 25-OH vitamin D less than 20 ng/mL (1,2). The Endocrine Society went on to further define vitamin D insufficiency as a level between 21 and 29 ng/mL (2). 1. IOM (Institute of Medicine). 2010. Dietary reference    intakes for calcium and D. [place]: The    [employer]. 2. Holick MF, Binkley Brooks, Bischoff-Ferrari HA, et al.    Evaluation, treatment, and prevention of vitamin D    deficiency: an Endocrine Society clinical practice    guideline. JCEM. 2011 Jul; 96(7):1911-30.     Assessment/Plan:  Vitamin D deficiency Obtained Vit-D levels and returned severely low. Will start patient on Vit-D2 50,000U a week for 8 weeks and then switch to Vit-D3 800U daily for and additional 4 weeks and recheck.  I will also obtain PTH to further work up Vit-D deficiency as patient has no known kidney disease and eGFR was >60.  TOBACCO USER Patient is motivated and came in requesting aid in quitting smoking.   We will start on with Nicotine Patch 58m daily and with Chantix 0.572mdaily for the first 3 days and then 32m64maily starting on day 4. I will follow up with him in 1 month to reassess how he is doing and make adjustments as needed.  Other emphysema ([facility] has unclear emphysema changes versus COPD due to recent chest CTA. His long history of smoking certainly increases his risk and it could be the cause of his SOB.   I will have him return and do PFTs in the office here to assess for lung function to determine his diagnosis of COPD and determine what if any medications he needs to be started on for optimal therapy.     PATIENT EDUCATION PROVIDED: See AVS    Diagnosis and plan along with any newly prescribed medication(s) were discussed in detail with this patient today. The patient verbalized understanding  and agreed with the plan. Patient advised if symptoms worsen return to clinic or ER.   Health Maintainance:   Orders Placed This Encounter  Procedures  . Vitamin D, 25-hydroxy  . Pulmonary function test    Standing Status:   Future    Standing Expiration Date:   12/23/2018    Order Specific Question:   Where should this test be performed?    Answer:   Other    Order Specific Question:   Diffusion capacity (DLCO)    Answer:   Yes    Order Specific Question:   Lung volumes    Answer:   Yes    Meds ordered this encounter  Medications  . varenicline (CHANTIX) 0.5 MG tablet    Sig: Take 1 tablet (0.5 mg total) by mouth 2 (two) times daily. Day 1-3 take 1 tab per day. Then take one tab twice per day starting on day 4.    Dispense:  60 tablet    Refill:  10  . nicotine (NICODERM CQ - DOSED IN MG/24 HOURS) 14 mg/24hr patch    Sig: Place 1 patch (14 mg total) onto the skin daily.    Dispense:  28 patch    Refill:  [place], DO 12/24/2017, 9:16 AM PGY-1, [place]

## 2017-12-24 LAB — VITAMIN D 25 HYDROXY (VIT D DEFICIENCY, FRACTURES): Vit D, 25-Hydroxy: 4.3 ng/mL — ABNORMAL LOW (ref 30.0–100.0)

## 2017-12-24 NOTE — Assessment & Plan Note (Signed)
Patient has unclear emphysema changes versus COPD due to recent chest CTA. His long history of smoking certainly increases his risk and it could be the cause of his SOB.   I will have him return and do PFTs in the office here to assess for lung function to determine his diagnosis of COPD and determine what if any medications he needs to be started on for optimal therapy.

## 2017-12-24 NOTE — Assessment & Plan Note (Signed)
Obtained Vit-D levels and returned severely low. Will start patient on Vit-D2 50,000U a week for 8 weeks and then switch to Vit-D3 800U daily for and additional 4 weeks and recheck.  I will also obtain PTH to further work up Vit-D deficiency as patient has no known kidney disease and eGFR was >60.

## 2017-12-24 NOTE — Assessment & Plan Note (Signed)
Patient is motivated and came in requesting aid in quitting smoking.   We will start on with Nicotine Patch 14mg  daily and with Chantix 0.5mg  daily for the first 3 days and then 1mg  daily starting on day 4. I will follow up with him in 1 month to reassess how he is doing and make adjustments as needed.

## 2017-12-25 ENCOUNTER — Encounter: Payer: Self-pay | Admitting: Family Medicine

## 2017-12-25 NOTE — Progress Notes (Signed)
Reprinting note, see previous note

## 2017-12-26 ENCOUNTER — Other Ambulatory Visit: Payer: Self-pay | Admitting: Family Medicine

## 2017-12-26 MED ORDER — ERGOCALCIFEROL 1.25 MG (50000 UT) PO CAPS: 50000.0000 [IU] | ORAL_CAPSULE | ORAL | 0 refills | Status: AC

## 2017-12-26 MED ORDER — BUPROPION HCL ER (XL) 150 MG PO TB24: 150.0000 mg | ORAL_TABLET | Freq: Every day | ORAL | 3 refills | Status: DC

## 2017-12-26 NOTE — Progress Notes (Signed)
Called patient and informed him of low Vit. D and informed him to pick up Vit D2 and take as prescribed, once weekly.   Patient stated he could not afford Chantix so cancelled that prescription and wrote him a prescription for Wellbutrin. Started on 150mg  for 3 days, then increase to 300mg .

## 2017-12-28 ENCOUNTER — Emergency Department (HOSPITAL_BASED_OUTPATIENT_CLINIC_OR_DEPARTMENT_OTHER)
Admission: EM | Admit: 2017-12-28 | Discharge: 2017-12-28 | Disposition: A | Discharge status: Home / Self Care | Payer: BLUE CROSS/BLUE SHIELD | Attending: Emergency Medicine | Admitting: Emergency Medicine

## 2017-12-28 ENCOUNTER — Other Ambulatory Visit: Payer: Self-pay

## 2017-12-28 ENCOUNTER — Emergency Department (HOSPITAL_BASED_OUTPATIENT_CLINIC_OR_DEPARTMENT_OTHER): Payer: BLUE CROSS/BLUE SHIELD

## 2017-12-28 ENCOUNTER — Encounter (HOSPITAL_BASED_OUTPATIENT_CLINIC_OR_DEPARTMENT_OTHER): Payer: Self-pay | Admitting: *Deleted

## 2017-12-28 DIAGNOSIS — F17210 Nicotine dependence, cigarettes, uncomplicated: Secondary | ICD-10-CM | POA: Insufficient documentation

## 2017-12-28 DIAGNOSIS — F1210 Cannabis abuse, uncomplicated: Secondary | ICD-10-CM | POA: Diagnosis not present

## 2017-12-28 DIAGNOSIS — Z79899 Other long term (current) drug therapy: Secondary | ICD-10-CM | POA: Diagnosis not present

## 2017-12-28 DIAGNOSIS — N433 Hydrocele, unspecified: Secondary | ICD-10-CM | POA: Diagnosis not present

## 2017-12-28 DIAGNOSIS — R1032 Left lower quadrant pain: Secondary | ICD-10-CM | POA: Diagnosis not present

## 2017-12-28 DIAGNOSIS — R109 Unspecified abdominal pain: Secondary | ICD-10-CM | POA: Diagnosis not present

## 2017-12-28 DIAGNOSIS — R1030 Lower abdominal pain, unspecified: Secondary | ICD-10-CM | POA: Diagnosis not present

## 2017-12-28 DIAGNOSIS — N50812 Left testicular pain: Secondary | ICD-10-CM | POA: Insufficient documentation

## 2017-12-28 DIAGNOSIS — R350 Frequency of micturition: Secondary | ICD-10-CM | POA: Diagnosis not present

## 2017-12-28 DIAGNOSIS — N50819 Testicular pain, unspecified: Secondary | ICD-10-CM

## 2017-12-28 LAB — COMPREHENSIVE METABOLIC PANEL
ALT: 19 U/L (ref 17–63)
AST: 16 U/L (ref 15–41)
Albumin: 4.2 g/dL (ref 3.5–5.0)
Alkaline Phosphatase: 84 U/L (ref 38–126)
Anion gap: 7 (ref 5–15)
BUN: 9 mg/dL (ref 6–20)
CO2: 28 mmol/L (ref 22–32)
Calcium: 9.2 mg/dL (ref 8.9–10.3)
Chloride: 104 mmol/L (ref 101–111)
Creatinine, Ser: 0.71 mg/dL (ref 0.61–1.24)
GFR calc Af Amer: >60 mL/min (ref >60)
GFR calc non Af Amer: >60 mL/min (ref >60)
Glucose, Bld: 69 mg/dL (ref 65–99)
Potassium: 4 mmol/L (ref 3.5–5.1)
Sodium: 139 mmol/L (ref 135–145)
Total Bilirubin: 0.7 mg/dL (ref 0.3–1.2)
Total Protein: 8.3 g/dL — ABNORMAL HIGH (ref 6.5–8.1)

## 2017-12-28 LAB — URINALYSIS, ROUTINE W REFLEX MICROSCOPIC
Bilirubin Urine: NEGATIVE
Glucose, UA: NEGATIVE mg/dL
Hgb urine dipstick: NEGATIVE
Ketones, ur: NEGATIVE mg/dL
Leukocytes, UA: NEGATIVE
Nitrite: NEGATIVE
Protein, ur: NEGATIVE mg/dL
Specific Gravity, Urine: 1.02 (ref 1.005–1.030)
pH: 6 (ref 5.0–8.0)

## 2017-12-28 LAB — CBC WITH DIFFERENTIAL/PLATELET
Basophils Absolute: 0 10*3/uL (ref 0.0–0.1)
Basophils Relative: 0 %
Eosinophils Absolute: 0.1 10*3/uL (ref 0.0–0.7)
Eosinophils Relative: 2 %
HCT: 40.9 % (ref 39.0–52.0)
Hemoglobin: 13.8 g/dL (ref 13.0–17.0)
Lymphocytes Relative: 38 %
Lymphs Abs: 2.5 10*3/uL (ref 0.7–4.0)
MCH: 29.7 pg (ref 26.0–34.0)
MCHC: 33.7 g/dL (ref 30.0–36.0)
MCV: 88 fL (ref 78.0–100.0)
Monocytes Absolute: 0.5 10*3/uL (ref 0.1–1.0)
Monocytes Relative: 7 %
Neutro Abs: 3.5 10*3/uL (ref 1.7–7.7)
Neutrophils Relative %: 53 %
Platelets: 245 10*3/uL (ref 150–400)
RBC: 4.65 MIL/uL (ref 4.22–5.81)
RDW: 14.2 % (ref 11.5–15.5)
WBC: 6.7 10*3/uL (ref 4.0–10.5)

## 2017-12-28 MED ORDER — CIPROFLOXACIN HCL 500 MG PO TABS: 500.0000 mg | ORAL_TABLET | Freq: Two times a day (BID) | ORAL | 0 refills | Status: DC

## 2017-12-28 MED ORDER — IOPAMIDOL (ISOVUE-300) INJECTION 61%
100.0000 mL | Freq: Once | INTRAVENOUS | Status: AC | PRN
  Administered 2017-12-28: 100 mL via INTRAVENOUS

## 2017-12-28 NOTE — ED Notes (Signed)
Pt called in waiting room, family states pt in restroom.

## 2017-12-28 NOTE — ED Triage Notes (Signed)
Pt c/o urinary freq with scrotum pain x 3 days

## 2017-12-28 NOTE — Discharge Instructions (Signed)
Please read attached information. If you experience any new or worsening signs or symptoms please return to the emergency room for evaluation. Please follow-up with your primary care provider or specialist as discussed. Please use medication prescribed only as directed and discontinue taking if you have any concerning signs or symptoms.   °

## 2017-12-28 NOTE — ED Notes (Signed)
Patient transported to CT 

## 2017-12-28 NOTE — ED Provider Notes (Signed)
[facility] Provider Note   CSN: [id number] Arrival date & time: 12/28/17  0808     History   Chief Complaint Chief Complaint  Patient presents with  . Urinary Frequency    HPI [facility] is a 52 y.o. male.  HPI   52 year old male presents today with complaints of urinary frequency and testicular pain.  Patient notes 3 days of symptoms that have progressively worsened.  He notes pain in the left testicle and the urge to urinate frequently.  Patient denies any painful urinations, denies any trauma to the testicle, abdominal pain, fever, nausea or vomiting.  Patient reports that when he lifts his testicle this improves his symptoms.  Patient denies any penile discharge, reports he sexually active with one male partner with vaginal intercourse.  History reviewed. No pertinent past medical history.  Patient Active Problem List   Diagnosis Date Noted  . Other emphysema ([place]) 12/23/2017  . Unstable angina ([place]) 11/10/2016  . SHOULDER PAIN, BILATERAL 08/09/2010  . Vitamin D deficiency 12/13/2009  . GLUCOSE INTOLERANCE 12/13/2009  . DENTAL CARIES 12/13/2009  . BACK PAIN 12/13/2009  . LIVER PAIN 11/29/2009  . TOBACCO USER 10/31/2009  . DEPRESSION 10/31/2009  . ALLERGIC RHINITIS 10/31/2009  . GERD 10/31/2009    Past Surgical History:  Procedure Laterality Date  . APPENDECTOMY         Home Medications    Prior to Admission medications   Medication Sig Start Date End Date Taking? Authorizing Provider  acetaminophen (TYLENOL) 500 MG tablet Take 1,000 mg by mouth every 6 (six) hours as needed for moderate pain.    [provider]  albuterol (PROVENTIL HFA;VENTOLIN HFA) 108 (90 Base) MCG/ACT inhaler Inhale 2 puffs into the lungs every 4 (four) hours as needed for wheezing or shortness of breath. 11/11/17   Joy, Shawn C, PA-C  buPROPion (WELLBUTRIN XL) 150 MG 24 hr tablet Take 1 tablet (150 mg total) by mouth daily. Increase to 2  tablets on day 4 for total of 300mg  daily. 12/26/17   Nuala Alpha, DO  ciprofloxacin (CIPRO) 500 MG tablet Take 1 tablet (500 mg total) by mouth every 12 (twelve) hours. 12/28/17   Jaisa Defino, Dellis Filbert, PA-C  ergocalciferol (VITAMIN D2) 50000 units capsule Take 1 capsule (50,000 Units total) by mouth once a week. 12/27/17 02/21/18  Nuala Alpha, DO  Multiple Vitamin (MULTIVITAMIN) tablet Take 1 tablet daily by mouth.    [provider]  nicotine (NICODERM CQ - DOSED IN MG/24 HOURS) 14 mg/24hr patch Place 1 patch (14 mg total) onto the skin daily. 12/23/17   Nuala Alpha, DO    Family History History reviewed. No pertinent family history.  Social History Social History   Tobacco Use  . Smoking status: Current Every Day Smoker    Packs/day: 0.50    Years: 30.00    Pack years: 15.00    Types: Cigarettes  . Smokeless tobacco: Never Used  Substance Use Topics  . Alcohol use: Yes    Comment: soc  . Drug use: Yes    Types: Marijuana     Allergies   Patient has no known allergies.   Review of Systems Review of Systems  All other systems reviewed and are negative.    Physical Exam Updated Vital Signs BP (!) 110/58 (BP Location: Right Arm)   Pulse (!) 58   Temp 98.7 F (37.1 C) (Oral)   Resp 18   Ht 5\' 7"  (1.702 m)   Wt 88 kg (  194 lb)   SpO2 98%   BMI 30.38 kg/m   Physical Exam  Constitutional: He is oriented to person, place, and time. He appears well-developed and well-nourished.  HENT:  Head: Normocephalic and atraumatic.  Eyes: Conjunctivae are normal. Pupils are equal, round, and reactive to light. Right eye exhibits no discharge. Left eye exhibits no discharge. No scleral icterus.  Neck: Normal range of motion. No JVD present. No tracheal deviation present.  Pulmonary/Chest: Effort normal. No stridor.  Abdominal:  Minor discomfort with palpation of the left lower pelvis-no masses-remainder of abdominal exam without acute findings  Genitourinary:    Genitourinary Comments: Uncircumcised male-normal-appearing penis and scrotum, no edema, tenderness palpation of the posterior superior aspect of the left testicle-no palpable hernias in the inguinal canal  Neurological: He is alert and oriented to person, place, and time. Coordination normal.  Psychiatric: He has a normal mood and affect. His behavior is normal. Judgment and thought content normal.  Nursing note and vitals reviewed.    ED Treatments / Results  Labs (all labs ordered are listed, but only abnormal results are displayed) Labs Reviewed  COMPREHENSIVE METABOLIC PANEL - Abnormal; Notable for the following components:      Result Value   Total Protein 8.3 (*)    All other components within normal limits  URINE CULTURE  URINALYSIS, ROUTINE W REFLEX MICROSCOPIC  CBC WITH DIFFERENTIAL/PLATELET  GC/CHLAMYDIA PROBE AMP ([place]) NOT AT [facility]    EKG  EKG Interpretation None       Radiology Ct Abdomen Pelvis W Contrast  Result Date: 12/28/2017 CLINICAL DATA:  Unspecified abdominal pain. Left lower quadrant and left groin pain. EXAM: CT ABDOMEN AND PELVIS WITH CONTRAST TECHNIQUE: Multidetector CT imaging of the abdomen and pelvis was performed using the standard protocol following bolus administration of intravenous contrast. CONTRAST:  150mL ISOVUE-300 IOPAMIDOL (ISOVUE-300) INJECTION 61% COMPARISON:  None. FINDINGS: Lower chest: No acute finding. Calcified granuloma in the right lower lobe. Hepatobiliary: No focal liver abnormality.No evidence of biliary obstruction or stone. Pancreas: Unremarkable. Spleen: Unremarkable. Adrenals/Urinary Tract: Negative adrenals. No hydronephrosis or stone. Unremarkable bladder. Stomach/Bowel: No obstruction. No appendicitis. Rare colonic diverticula. Vascular/Lymphatic: No acute vascular abnormality. Mild atherosclerotic calcification. No mass or adenopathy. Reproductive:No specific explanation for pain. Status post preceding scrotal  ultrasound. Other: No ascites or pneumoperitoneum. Musculoskeletal: No acute finding. Lumbar spondylosis and mild L4-5 facet spurring. Small simple appearing lipoma or posttraumatic fatty atrophy in the upper right gluteus medius measuring 2.2 cm. IMPRESSION: No acute finding or explanation for pain. Electronically Signed   By: Monte Fantasia M.D.   On: 12/28/2017 11:57   [place] Scrotom W/doppler  Result Date: 12/28/2017 CLINICAL DATA:  Left scrotal region discomfort for 3 days EXAM: SCROTAL ULTRASOUND DOPPLER ULTRASOUND OF THE TESTICLES TECHNIQUE: Complete ultrasound examination of the testicles, epididymis, and other scrotal structures was performed. Color and spectral Doppler ultrasound were also utilized to evaluate blood flow to the testicles. COMPARISON:  None. FINDINGS: Right testicle Measurements: 4.2 x 1.9 x 3.1 cm. No mass or microlithiasis visualized. Left testicle Measurements: 4.1 x 1.9 x 2.8 cm. No mass or microlithiasis visualized. Right epididymis:  Normal in size and appearance. Left epididymis:  Normal in size and appearance. Hydrocele: There are small hydroceles bilaterally, slightly larger on the right than on the left. Debris is noted in the hydrocele on the right. Varicocele:  None visualized. Pulsed Doppler interrogation of both testes demonstrates normal low resistance arterial and venous waveforms bilaterally. There is no scrotal wall thickening  or scrotal abscess. IMPRESSION: 1. Small hydroceles bilaterally, slightly larger on the right than on the left. Debris in the right hydrocele raises concern for potential infection within this hydrocele. 2. No intratesticular or extratesticular mass noted on either side. No orchitis or epididymitis evident on either side. 3.  No testicular torsion on either side. Electronically Signed   By: Lowella Grip III M.D.   On: 12/28/2017 10:15    Procedures Procedures (including critical care time)  Medications Ordered in ED Medications    iopamidol (ISOVUE-300) 61 % injection 100 mL (100 mLs Intravenous Contrast Given 12/28/17 1137)     Initial Impression / Assessment and Plan / ED Course  I have reviewed the triage vital signs and the nursing notes.  Pertinent labs & imaging results that were available during my care of the patient were reviewed by me and considered in my medical decision making (see chart for details).     Final Clinical Impressions(s) / ED Diagnoses   Final diagnoses:  Testicular pain    Labs: CBC, CMP  Imaging: CT abdomen pelvis with contrast, scrotal ultrasound  Consults:  Therapeutics:  Discharge Meds: Cipro  Assessment/Plan: 52 year old male presents today with complaints of testicular and lower abdominal pain.  He has ultrasound showing right-sided questionable infected hydrocele, no pain on the right.  Patient's laboratory analysis is reassuring, CT scan reassuring.  Patient will be covered for potential infectious source with antibiotics.  I encouraged him to follow-up with urology if symptoms continue to persist beyond the next 24-36 hours.  Patient is given strict return precautions, he verbalized understanding and agreement to today's plan had no further questions or concerns at the time of discharge.      ED Discharge Orders        Ordered    ciprofloxacin (CIPRO) 500 MG tablet  Every 12 hours     12/28/17 1249       Okey Regal, PA-C 12/28/17 1251    Fredia Sorrow, MD 12/28/17 1553

## 2017-12-28 NOTE — ED Notes (Signed)
NAD at this time. Pt is stable and going home.  

## 2017-12-29 LAB — URINE CULTURE: Culture: NO GROWTH

## 2017-12-30 ENCOUNTER — Ambulatory Visit: Payer: Self-pay | Admitting: Pharmacist

## 2017-12-30 LAB — GC/CHLAMYDIA PROBE AMP (~~LOC~~) NOT AT ARMC
Chlamydia: NEGATIVE
Neisseria Gonorrhea: NEGATIVE

## 2018-01-01 ENCOUNTER — Other Ambulatory Visit: Payer: Self-pay

## 2018-01-01 ENCOUNTER — Encounter: Payer: Self-pay | Admitting: Student in an Organized Health Care Education/Training Program

## 2018-01-01 ENCOUNTER — Ambulatory Visit (INDEPENDENT_AMBULATORY_CARE_PROVIDER_SITE_OTHER): Payer: BLUE CROSS/BLUE SHIELD | Admitting: Student in an Organized Health Care Education/Training Program

## 2018-01-01 VITALS — BP 110/60 | HR 63 | Temp 98.2°F | Ht 67.0 in | Wt 186.4 lb

## 2018-01-01 DIAGNOSIS — N451 Epididymitis: Secondary | ICD-10-CM

## 2018-01-01 MED ORDER — DOXYCYCLINE HYCLATE 100 MG PO TABS: 100.0000 mg | ORAL_TABLET | Freq: Two times a day (BID) | ORAL | 0 refills | Status: DC

## 2018-01-01 NOTE — Patient Instructions (Addendum)
It was a pleasure seeing you today in our clinic. Today we discussed your testicle pain. Here is the treatment plan we have discussed and agreed upon together:  Please STOP the ciprofloxacin  Please START doxycycline for a ten day course  Please schedule follow up to be seen on Monday. Come in sooner if your symptoms WORSEN.  Our clinic's number is [phone]. Please call with questions or concerns about what we discussed today.  Be well, Dr. Burr Medico

## 2018-01-01 NOTE — Assessment & Plan Note (Addendum)
Symptoms persisting despite initiation of antibiotics. No red flags on exam and I am reassured by a negative [place] for torsion 3 days ago in ED. No skin changes to think of cellulitis or herpes outbreak. No "bag of worms" to think of varicocele. I do not feel any hernias, although it is possible that with bearing down very hard while lifting at work that a hernia may exist, but no trapped hernia at this time. - We can broaden antibiotics at this time to doxycycline.  - Patient was given strict return precautions. - He is to make a follow up to be seen on Friday or Monday, sooner if pain worsens or fails to improve - Work note provided for next 2 days - Next steps: would re-examine if symptoms persist, would consider urology referral

## 2018-01-01 NOTE — Progress Notes (Signed)
   CC: testicular pain  HPI: [facility] is a 52 y.o. male presenting for left testicular pain of 6 days duration.  Left Testicular pain Onset was 6 days ago at work, he was doing nothing specific when it came on, not lifting. He was seen in the emergency department for this 3 days ago and prescribed ciprofloxacin. He reports pain is better when he is lying down or lifting his testicle. He reports it is worse with straining. He has taken tylenol and ibuprofen for pain. He has not had fevers. He has not had prior episodes.  Workup in the ED included testicular US which was negative for torsion, but did note hydrocele on the right side (pain is on the left). CT scan did not illuminate a cause of his pain. He was sent with ciprofloxacin, however pain has persisted and has not improved over the last 3 days. No fevers. Denies seeing anything abnormal such as redness, warmth or swelling of the testicle.  No abdominal pain, N/V/D/C. No urinary symptoms.  Review of Symptoms:  See HPI for ROS.   CC, SH/smoking status, and VS noted.  Objective: BP 110/60   Pulse 63   Temp 98.2 F (36.8 C) (Oral)   Ht 5\' 7"  (1.702 m)   Wt 186 lb 6.4 oz (84.6 kg)   SpO2 99%   BMI 29.19 kg/m  GEN: NAD, alert, cooperative, and pleasant. ABDOMEN: Soft, nontender, nondistended GU: no penile lesions or discharge no testicular masses no bladder distension noted Testicles: normal, no masses. No skin changes, rash, edema, redness or swelling. Normal testicular rugae. No "bag of worms". +mild tenderness along the back edge of the testicle with no fullness appreciated. Scrotum: normal Epididymis: normal Hernia: No palpable hernia with or without bearing down PSYCH: AAOx3, appropriate affect  Assessment and plan:  Epididymitis Symptoms persisting despite initiation of antibiotics. No red flags on exam and I am reassured by a negative [place] for torsion 3 days ago in ED. No skin changes to think of cellulitis or  herpes outbreak. No "bag of worms" to think of varicocele. I do not feel any hernias, although it is possible that with bearing down very hard while lifting at work that a hernia may exist, but no trapped hernia at this time. - We can broaden antibiotics at this time to doxycycline.  - Patient was given strict return precautions. - He is to make a follow up to be seen on Friday or Monday, sooner if pain worsens or fails to improve - Next steps: would re-examine if symptoms persist, would consider urology referral  Meds ordered this encounter  Medications  . doxycycline (VIBRA-TABS) 100 MG tablet    Sig: Take 1 tablet (100 mg total) by mouth 2 (two) times daily.    Dispense:  20 tablet    Refill:  0    Everrett Coombe, MD,MS,  PGY2 01/01/2018 12:17 PM

## 2018-01-02 ENCOUNTER — Ambulatory Visit (INDEPENDENT_AMBULATORY_CARE_PROVIDER_SITE_OTHER): Payer: BLUE CROSS/BLUE SHIELD | Admitting: Pharmacist

## 2018-01-02 ENCOUNTER — Encounter: Payer: Self-pay | Admitting: Pharmacist

## 2018-01-02 DIAGNOSIS — J438 Other emphysema: Secondary | ICD-10-CM

## 2018-01-02 DIAGNOSIS — F17200 Nicotine dependence, unspecified, uncomplicated: Secondary | ICD-10-CM | POA: Diagnosis not present

## 2018-01-02 NOTE — Assessment & Plan Note (Signed)
Spirometry evaluation reveals near normal PFT in a patient with > 40 pack year history of smoking who has now quit for 4 days with use of Chantix and 14mg  nicotine patch.

## 2018-01-02 NOTE — Patient Instructions (Addendum)
Stop taking Bupropion (brand name is Wellbutrin)   Continue taking Chantix to help you stop smoking 0.5mg  twice daily WITH FOOD.   Stop taking the nicotine patches.  Follow up with Dr. Garlan Fillers in 3 weeks.

## 2018-01-02 NOTE — Progress Notes (Signed)
   S:    Patient arrives ambulating without assistance in good spirits with his wife Preston Sullivan. Presents for lung function evaluation. Patient was referred on 12/23/2017 by Primary Care Provider Dr. Garlan Fillers. Patient reports vivid dreams while taking chantix 0.5mg  and nicotine patch 14mg .   Clarified that the patient is NOT and does NOT want to take buproprion.    Patient reports last dose of COPD medications was 3 weeks ago (albuterol). Patient reports 10/10 confidence in quitting smoking since beginning chantix and nicotine patches, has had no cravings, and has not smoked since quit date 12/29/17.  O: See Documentation Flowsheet - CAT/COPD for complete symptom scoring.  See "scanned report" or Documentation Flowsheet (discrete results - PFTs) for  Spirometry results. Patient provided good effort while attempting spirometry.   Lung Age = 6  A/P: Spirometry evaluation reveals near normal PFT in a patient with > 40 pack year history of smoking who has now quit for 4 days with use of Chantix and 14mg  nicotine patch.   Chronic tobacco use:  Recently quit X 4 days and reports doing well on current NRT and chantix therapy. AGree with NOT starting bupropion 150 mg XL. Discontinue nicotine patches 14mg  to reduce troublesome dreams. Educated patient on importance of taking chantix 0.5mg  with meals. Reviewed results of pulmonary function tests.  Pt verbalized understanding of results and education.  Written pt instructions provided.  F/U Clinic visit in 3 weeks. Total time in face to face counseling 25 minutes.  Patient seen with Onnie Boer, PharmD Candidate and Deirdre Pippins, PGY2 Pharmacy Resident, PharmD, BCPS.  Marland Kitchen

## 2018-01-02 NOTE — Assessment & Plan Note (Signed)
Chronic tobacco use:  Recently quit X 4 days and reports doing well on current NRT and chantix therapy. AGree with NOT starting bupropion 150 mg XL. Discontinue nicotine patches 14mg  to reduce troublesome dreams. Educated patient on importance of taking chantix 0.5mg  with meals. Reviewed results of pulmonary function tests.  Pt verbalized understanding of results and education.  Written pt instructions provided.  F/U Clinic visit in 3 weeks.

## 2018-01-06 ENCOUNTER — Ambulatory Visit: Payer: BLUE CROSS/BLUE SHIELD | Admitting: Internal Medicine

## 2018-01-21 ENCOUNTER — Other Ambulatory Visit: Payer: Self-pay

## 2018-01-21 ENCOUNTER — Ambulatory Visit (INDEPENDENT_AMBULATORY_CARE_PROVIDER_SITE_OTHER): Payer: BLUE CROSS/BLUE SHIELD | Admitting: Family Medicine

## 2018-01-21 ENCOUNTER — Encounter: Payer: Self-pay | Admitting: Family Medicine

## 2018-01-21 ENCOUNTER — Encounter: Payer: Self-pay | Admitting: Gastroenterology

## 2018-01-21 VITALS — BP 110/60 | HR 86 | Temp 98.3°F | Ht 67.0 in | Wt 195.0 lb

## 2018-01-21 DIAGNOSIS — F17200 Nicotine dependence, unspecified, uncomplicated: Secondary | ICD-10-CM | POA: Diagnosis not present

## 2018-01-21 DIAGNOSIS — Z0000 Encounter for general adult medical examination without abnormal findings: Secondary | ICD-10-CM | POA: Diagnosis not present

## 2018-01-21 DIAGNOSIS — Z23 Encounter for immunization: Secondary | ICD-10-CM

## 2018-01-21 DIAGNOSIS — E739 Lactose intolerance, unspecified: Secondary | ICD-10-CM | POA: Diagnosis not present

## 2018-01-21 DIAGNOSIS — G8929 Other chronic pain: Secondary | ICD-10-CM | POA: Diagnosis not present

## 2018-01-21 DIAGNOSIS — M545 Low back pain, unspecified: Secondary | ICD-10-CM

## 2018-01-21 DIAGNOSIS — E559 Vitamin D deficiency, unspecified: Secondary | ICD-10-CM | POA: Diagnosis not present

## 2018-01-21 DIAGNOSIS — J438 Other emphysema: Secondary | ICD-10-CM | POA: Diagnosis not present

## 2018-01-21 LAB — POCT GLYCOSYLATED HEMOGLOBIN (HGB A1C): Hemoglobin A1C: 5.7

## 2018-01-21 NOTE — Progress Notes (Signed)
Subjective: No chief complaint on file.    HPI: Preston Sullivan is a 52 y.o. presenting to clinic today to discuss the following:  Follow up for smoking cessation: Patient was started on Buproprion at last visit. Today he reports that he has not smoked at all since his last visit at our clinic with me 3 weeks ago. He did have some "vivid and strange" dreams but they have ceased. He states he is no longer needing the nicotine patch. He states his cravings are "under control" and he rarely wants a cigarette.  Follow up results for PFTs: Patient had near normal PFTs at our clinic. He currently takes albuterol as needed. He reports no more SOB and he has not needed the albuterol since his last visit. I informed him his PFTs were normal and no other medications were required at this time.  Vitamin D deficiency: Patient had low Vitamin D levels and was prescribed Vit-D2. He has been taking this as prescribed. Discussed the length of treatment and that we will recheck levels.  Low Back Pain: Chronic. Patient describes the pain as sharp, intermittent non-radiating that has been going on "for years". He states getting up and moving makes it better. He has tried Tylenol, Ibuprofen, and heating pads that provide little relief. Activity makes it worse and it gets worse when he sits for long periods of time when he first sits up.  Review of Systems  Constitutional: Negative for chills, fever and weight loss.  HENT: Negative for congestion and sore throat.   Respiratory: Negative for cough, sputum production, shortness of breath and wheezing.   Cardiovascular: Negative for chest pain.  Gastrointestinal: Negative for abdominal pain, nausea and vomiting.  Genitourinary: Negative for dysuria, frequency and urgency.  Musculoskeletal: Positive for back pain.   Health Maintenance: HIV screen, Tetanus, contact info given for colonoscopy    ROS noted in HPI.   Past Medical, Surgical, Social, and Family  History Reviewed & Updated per EMR.   Pertinent Historical Findings include:   Social History   Tobacco Use  Smoking Status Former Smoker  . Packs/day: 1.00  . Years: 42.00  . Pack years: 42.00  . Types: Cigarettes  . Start date: 12/11/1975  . Last attempt to quit: 12/30/2017  . Years since quitting: 0.0  Smokeless Tobacco Never Used      Objective: BP 110/60   Pulse 86   Temp 98.3 F (36.8 C) (Oral)   Ht 5\' 7"  (1.702 m)   Wt 195 lb (88.5 kg)   SpO2 98%   BMI 30.54 kg/m  Vitals and nursing notes reviewed  Physical Exam  Constitutional: He is oriented to person, place, and time and well-developed, well-nourished, and in no distress. No distress.  HENT:  Head: Normocephalic and atraumatic.  Eyes: EOM are normal. Pupils are equal, round, and reactive to light.  Cardiovascular: Normal rate, regular rhythm and intact distal pulses.  No murmur heard. Pulmonary/Chest: Effort normal and breath sounds normal. He has no wheezes. He has no rales.  Abdominal: Soft. Bowel sounds are normal. There is no tenderness. There is no rebound.  Musculoskeletal: Normal range of motion. He exhibits tenderness. He exhibits no deformity.  Right sided back musculature TTP  Neurological: He is alert and oriented to person, place, and time.  Skin: Skin is warm and dry. No rash noted.    Results for orders placed or performed in visit on 01/21/18 (from the past 72 hour(s))  POCT glycosylated hemoglobin (Hb  A1C)     Status: None   Collection Time: 01/21/18  2:20 PM  Result Value Ref Range   Hemoglobin A1C 5.7   HIV antibody (with reflex)     Status: None   Collection Time: 01/21/18  2:34 PM  Result Value Ref Range   HIV Screen 4th Generation wRfx Non Reactive Non Reactive    Assessment/Plan:  Vitamin D deficiency Patient is taking Vit-D2 as prescribed and will continue to take it for 5 more weeks. I will recheck his Vit-D2 level at that time.   If normal can continue with OTC  supplement.  Backache Most likely etiology is musculoskeletal. Highly unlikely to be Sciatica due to no radicular symptoms. Unlikely to be fracture due to chronic nature and no mechanism of injury.  Gave patient low back strength and flexibility exercise program. Instructed patient to do at least 5 days per week.   Healthcare maintenance One time HIV screen performed today per recommended healthcare maintenance.  Screened for diabetes since last HgbA1c was almost 2 years ago and pt is at increased risk due to ethnicity (African-American) and BMI is 29.  TOBACCO USER Pt doing well and has not had a cigarette in 3 weeks. Due to patient feeling cravings are under control I feel no need to increase dosage and will leave the same.   I also provided patient with the 1-800-QUITNOW hotline to help if he has uncontrolled cravings.  Will continue to follow and monitor progress.  Other emphysema (HCC) PFTs normal.  Continue Albuterol 2 puffs q4 as needed.  Will continue to monitor for symptoms and consider changing medical management if patient begins having more frequent or severe symptoms or requires hospital stay due to exacerbation.     PATIENT EDUCATION PROVIDED: See AVS    Diagnosis and plan along with any newly prescribed medication(s) were discussed in detail with this patient today. The patient verbalized understanding and agreed with the plan. Patient advised if symptoms worsen return to clinic or ER.   Health Maintainance:   Orders Placed This Encounter  Procedures  . Tdap vaccine greater than or equal to 7yo IM  . HIV antibody (with reflex)  . POCT glycosylated hemoglobin (Hb A1C)    No orders of the defined types were placed in this encounter.    Harolyn Rutherford, DO 01/22/2018, 11:22 PM PGY-1, [place] [facility]

## 2018-01-21 NOTE — Patient Instructions (Signed)
It was great to see you gain today! Thank you for letting me participate in your care!  Today, we discussed your lab results from last visit. Continue taking Vit-D2 as prescribed.   You are doing well with smoking cessation. Please call if you feel like the Chantix is no longer helping. You can also utilize the 1-800-QUITNOW hotline. It is very helpful in stopping smoking.  We discussed your back pain today and I have attached some recommended exercises and stretches. Please be sure to do this everyday and you will see great improvement.  Be well, Harolyn Rutherford, DO PGY-1, [facility]

## 2018-01-22 DIAGNOSIS — Z0000 Encounter for general adult medical examination without abnormal findings: Secondary | ICD-10-CM | POA: Insufficient documentation

## 2018-01-22 LAB — HIV ANTIBODY (ROUTINE TESTING W REFLEX): HIV Screen 4th Generation wRfx: NONREACTIVE

## 2018-01-22 NOTE — Assessment & Plan Note (Signed)
Most likely etiology is musculoskeletal. Highly unlikely to be Sciatica due to no radicular symptoms. Unlikely to be fracture due to chronic nature and no mechanism of injury.  Gave patient low back strength and flexibility exercise program. Instructed patient to do at least 5 days per week.

## 2018-01-22 NOTE — Assessment & Plan Note (Addendum)
One time HIV screen performed today per recommended healthcare maintenance.  Screened for diabetes since last HgbA1c was almost 2 years ago and pt is at increased risk due to ethnicity (African-American) and BMI is 29.

## 2018-01-22 NOTE — Assessment & Plan Note (Signed)
Pt doing well and has not had a cigarette in 3 weeks. Due to patient feeling cravings are under control I feel no need to increase dosage and will leave the same.   I also provided patient with the 1-800-QUITNOW hotline to help if he has uncontrolled cravings.  Will continue to follow and monitor progress.

## 2018-01-22 NOTE — Assessment & Plan Note (Signed)
Patient is taking Vit-D2 as prescribed and will continue to take it for 5 more weeks. I will recheck his Vit-D2 level at that time.   If normal can continue with OTC supplement.

## 2018-01-22 NOTE — Assessment & Plan Note (Signed)
PFTs normal.  Continue Albuterol 2 puffs q4 as needed.  Will continue to monitor for symptoms and consider changing medical management if patient begins having more frequent or severe symptoms or requires hospital stay due to exacerbation.

## 2018-02-11 ENCOUNTER — Encounter (HOSPITAL_COMMUNITY): Payer: Self-pay | Admitting: Family Medicine

## 2018-02-11 ENCOUNTER — Ambulatory Visit (HOSPITAL_COMMUNITY)
Admission: EM | Admit: 2018-02-11 | Discharge: 2018-02-11 | Disposition: A | Discharge status: Home / Self Care | Payer: BLUE CROSS/BLUE SHIELD | Attending: Family Medicine | Admitting: Family Medicine

## 2018-02-11 DIAGNOSIS — M25512 Pain in left shoulder: Secondary | ICD-10-CM | POA: Diagnosis not present

## 2018-02-11 DIAGNOSIS — M25511 Pain in right shoulder: Secondary | ICD-10-CM

## 2018-02-11 MED ORDER — KETOROLAC TROMETHAMINE 30 MG/ML IJ SOLN
INTRAMUSCULAR | Status: AC
  Filled 2018-02-11: qty 1

## 2018-02-11 MED ORDER — NAPROXEN 500 MG PO TBEC: 500.0000 mg | DELAYED_RELEASE_TABLET | Freq: Two times a day (BID) | ORAL | 0 refills | Status: DC

## 2018-02-11 MED ORDER — KETOROLAC TROMETHAMINE 30 MG/ML IJ SOLN
30.0000 mg | Freq: Once | INTRAMUSCULAR | Status: AC
  Administered 2018-02-11: 30 mg via INTRAMUSCULAR

## 2018-02-11 MED ORDER — PREDNISONE 20 MG PO TABS: 40.0000 mg | ORAL_TABLET | Freq: Every day | ORAL | 0 refills | Status: AC

## 2018-02-11 NOTE — ED Triage Notes (Signed)
Pt here for bilateral shoulder pain with radiation into his neck. 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 He reports the pain became bad a few days ago and is unable to lay down or sleep. Numbness and tingling on arms and fingers. Taking ibuprofen and tylenol with out relief. Hx of slipped disc in back.

## 2018-02-11 NOTE — Discharge Instructions (Signed)
Ice to affected areas, massage, heat during day while active. See range of motion exercises. Twice a day naproxen, take with food. 5 days of prednisone. Please continue to follow with your primary care provider if symptoms persist. If further work restrictions or follow up needed please follow with occupational health.

## 2018-02-11 NOTE — ED Provider Notes (Signed)
[facility]    CSN: [id number] Arrival date & time: 02/11/18  1255     History   Chief Complaint Chief Complaint  Patient presents with  . Shoulder Pain  . Neck Pain    HPI Preston Sullivan is a 52 y.o. male.   Preston Sullivan presents with complaints of bilateral posterior shoulder pain which is worse with working. He states he has had this issue for some time but worsened three days ago. 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 He states his pain is severe even with sleeping, if he lays on the arm it causes numbness tingling sensation or if he puts his arms above his head to sleep they feel numb and tingling. Pain is 10/10. Took ibuprofen 400mg  last at 0830 this morning which minimally helped. Denies any previous neck or shoulder injury or surgeries. History of back pain, shoulder pain, emphysema.   ROS per HPI.       History reviewed. No pertinent past medical history.  Patient Active Problem List   Diagnosis Date Noted  . Healthcare maintenance 01/22/2018  . Epididymitis 01/01/2018  . Other emphysema ([place]) 12/23/2017  . Unstable angina ([place]) 11/10/2016  . SHOULDER PAIN, BILATERAL 08/09/2010  . Vitamin D deficiency 12/13/2009  . GLUCOSE INTOLERANCE 12/13/2009  . DENTAL CARIES 12/13/2009  . Backache 12/13/2009  . LIVER PAIN 11/29/2009  . TOBACCO USER 10/31/2009  . DEPRESSION 10/31/2009  . ALLERGIC RHINITIS 10/31/2009  . GERD 10/31/2009    Past Surgical History:  Procedure Laterality Date  . APPENDECTOMY         Home Medications    Prior to Admission medications   Medication Sig Start Date End Date Taking? Authorizing Provider  acetaminophen (TYLENOL) 500 MG tablet Take 1,000 mg by mouth every 6 (six) hours as needed for moderate pain.    [provider]  albuterol (PROVENTIL HFA;VENTOLIN HFA) 108 (90 Base) MCG/ACT inhaler Inhale 2 puffs into the lungs every 4 (four) hours as needed for wheezing or shortness of breath. 11/11/17    Joy, Shawn C, PA-C  CHANTIX 0.5 MG tablet Take 0.5 mg by mouth 2 (two) times daily. 12/24/17   [provider]  ergocalciferol (VITAMIN D2) 50000 units capsule Take 1 capsule (50,000 Units total) by mouth once a week. 12/27/17 02/21/18  Nuala Alpha, DO  Multiple Vitamin (MULTIVITAMIN) tablet Take 1 tablet daily by mouth.    [provider]  naproxen (EC NAPROSYN) 500 MG EC tablet Take 1 tablet (500 mg total) by mouth 2 (two) times daily with a meal. 02/11/18   Burky, Malachy Moan, NP  predniSONE (DELTASONE) 20 MG tablet Take 2 tablets (40 mg total) by mouth daily with breakfast for 5 days. 02/11/18 02/16/18  Zigmund Gottron, NP    Family History History reviewed. No pertinent family history.  Social History Social History   Tobacco Use  . Smoking status: Former Smoker    Packs/day: 1.00    Years: 42.00    Pack years: 42.00    Types: Cigarettes    Start date: 12/11/1975    Last attempt to quit: 12/30/2017    Years since quitting: 0.1  . Smokeless tobacco: Never Used  Substance Use Topics  . Alcohol use: Yes    Comment: soc  . Drug use: Yes    Types: Marijuana     Allergies   Patient has no known allergies.   Review of Systems Review of Systems   Physical Exam Triage Vital Signs  ED Triage Vitals  Enc Vitals Group     BP 02/11/18 1313 119/80     Pulse Rate 02/11/18 1313 69     Resp 02/11/18 1313 18     Temp 02/11/18 1313 98.3 F (36.8 C)     Temp src --      SpO2 02/11/18 1313 99 %     Weight --      Height --      Head Circumference --      Peak Flow --      Pain Score 02/11/18 1312 10     Pain Loc --      Pain Edu? --      Excl. in [place]? --    No data found.  Updated Vital Signs BP 119/80   Pulse 69   Temp 98.3 F (36.8 C)   Resp 18   SpO2 99%   Visual Acuity Right Eye Distance:   Left Eye Distance:   Bilateral Distance:    Right Eye Near:   Left Eye Near:    Bilateral Near:     Physical Exam  Constitutional: He is oriented to  person, place, and time. He appears well-developed and well-nourished.  Cardiovascular: Normal rate and regular rhythm.  Pulmonary/Chest: Effort normal and breath sounds normal.  Musculoskeletal:       Right shoulder: He exhibits decreased range of motion, tenderness and pain. He exhibits no bony tenderness, no swelling, no effusion, no crepitus, no deformity, no laceration, no spasm, normal pulse and normal strength.       Left shoulder: He exhibits decreased range of motion, tenderness and pain. He exhibits no bony tenderness, no swelling, no effusion, no crepitus, no deformity, no laceration, no spasm, normal pulse and normal strength.       Arms: Bilateral trapezius with tenderness on palpation and pain with activation with active shoulder ROM; without bony tenderness and tolerates arc ROm as well as external rotation; sensation intact, strong radial pulses bilaterally and strength equal to bilateral upper extremities; some point tenderness to trapezius musculature bilaterally  Neurological: He is alert and oriented to person, place, and time.  Skin: Skin is warm and dry.     UC Treatments / Results  Labs (all labs ordered are listed, but only abnormal results are displayed) Labs Reviewed - No data to display  EKG  EKG Interpretation None       Radiology No results found.  Procedures Procedures (including critical care time)  Medications Ordered in UC Medications  ketorolac (TORADOL) 30 MG/ML injection 30 mg (not administered)     Initial Impression / Assessment and Plan / UC Course  I have reviewed the triage vital signs and the nursing notes.  Pertinent labs & imaging results that were available during my care of the patient were reviewed by me and considered in my medical decision making (see chart for details).     History and physical consistent with muscular pain. Numbness/tingling with positioning, will provide 5 days of prednisone. Naproxen BID. Shoulder ROM  exercises provided. Weight restriction for the next few days. Encouraged continued follow up with PCP as may require further treatment in the future if symptoms persist. Return precautions provided. Patient verbalized understanding and agreeable to plan.    Final Clinical Impressions(s) / UC Diagnoses   Final diagnoses:  Bilateral shoulder pain, unspecified chronicity    ED Discharge Orders        Ordered    predniSONE (DELTASONE) 20 MG tablet  Daily with breakfast     02/11/18 1329    naproxen (EC NAPROSYN) 500 MG EC tablet  2 times daily with meals     02/11/18 1329       Controlled Substance Prescriptions [place] Controlled Substance Registry consulted? Not Applicable   Zigmund Gottron, NP 02/11/18 1342

## 2018-02-14 ENCOUNTER — Other Ambulatory Visit: Payer: Self-pay

## 2018-02-14 ENCOUNTER — Ambulatory Visit (AMBULATORY_SURGERY_CENTER): Payer: Self-pay

## 2018-02-14 VITALS — Ht 67.0 in | Wt 202.0 lb

## 2018-02-14 DIAGNOSIS — Z1211 Encounter for screening for malignant neoplasm of colon: Secondary | ICD-10-CM

## 2018-02-14 MED ORDER — NA SULFATE-K SULFATE-MG SULF 17.5-3.13-1.6 GM/177ML PO SOLN: 1.0000 | Freq: Once | ORAL | 0 refills | Status: AC

## 2018-02-14 NOTE — Progress Notes (Signed)
Denies allergies to eggs or soy products. Denies complication of anesthesia or sedation. Denies use of weight loss medication. Denies use of O2.   Emmi instructions declined.  

## 2018-02-18 ENCOUNTER — Other Ambulatory Visit: Payer: Self-pay

## 2018-02-18 ENCOUNTER — Ambulatory Visit (INDEPENDENT_AMBULATORY_CARE_PROVIDER_SITE_OTHER): Payer: BLUE CROSS/BLUE SHIELD | Admitting: Family Medicine

## 2018-02-18 VITALS — BP 122/80 | HR 98 | Temp 98.3°F | Wt 202.6 lb

## 2018-02-18 DIAGNOSIS — M545 Low back pain, unspecified: Secondary | ICD-10-CM

## 2018-02-18 DIAGNOSIS — G8929 Other chronic pain: Secondary | ICD-10-CM | POA: Diagnosis not present

## 2018-02-18 DIAGNOSIS — M25512 Pain in left shoulder: Secondary | ICD-10-CM | POA: Diagnosis not present

## 2018-02-18 DIAGNOSIS — M25511 Pain in right shoulder: Secondary | ICD-10-CM

## 2018-02-18 NOTE — Patient Instructions (Signed)
It was great meeting you today Preston Sullivan! I am glad that you are doing much better after getting the medications at the urgent care. I believe that you are now physically able to return back to work. I gave you my written permission that you are medically ready to return to full duty. I would schedule an appointment for the future with Dr. Garlan Fillers to discuss other options for preventing rhematoid arthritis flares in your shoulder.

## 2018-02-19 NOTE — Assessment & Plan Note (Signed)
Has resolved with prednisone and toradol. Will give clearance for patient to return to work. Patient claims to have been diagnosed with rheumatoid arthritis. Advised patient to schedule an appointment with Dr. Garlan Fillers to further discuss this matter.

## 2018-02-19 NOTE — Progress Notes (Signed)
   HPI 52 year old seen around 1 week ago for intense right shoulder, back, and right sided neck pain. Seen at urgent care and diagnosed with arthritis flare. Received toradol and prednisone. He now feels excellent and back to normal. 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 He is have no symptoms. No shooting pain in neck or shoulder.  CC: needs clearance to go back to work   ROS:  Review of Systems See HPI for ROS.   CC, SH/smoking status, and VS noted  Objective: BP 122/80   Pulse 98   Temp 98.3 F (36.8 C) (Oral)   Wt 202 lb 9.6 oz (91.9 kg)   SpO2 100%   BMI 31.73 kg/m  Gen: NAD, alert, cooperative, and pleasant. Muscular, african [place] male resting comfortably HEENT: NCAT, EOMI, PERRL CV: RRR, no murmur Resp: CTAB, no wheezes, non-labored Abd: SNTND, BS present, no guarding or organomegaly Ext: No edema, warm Neuro: Alert and oriented, Speech clear, No gross deficits MSK: no palpable MSK pain in right shoulder, neck or back. No limitation of motion with any movements BUE.   Assessment and plan:  Backache Has resolved with prednisone and toradol. Will give clearance for patient to return to work. Patient claims to have been diagnosed with rheumatoid arthritis. Advised patient to schedule an appointment with Dr. Garlan Fillers to further discuss this matter.   No orders of the defined types were placed in this encounter.   No orders of the defined types were placed in this encounter.    Guadalupe Dawn MD PGY-1 Family Medicine Resident 02/19/2018 9:52 AM

## 2018-02-21 ENCOUNTER — Encounter: Payer: Self-pay | Admitting: Gastroenterology

## 2018-03-01 ENCOUNTER — Other Ambulatory Visit: Payer: Self-pay | Admitting: Family Medicine

## 2018-03-01 DIAGNOSIS — E559 Vitamin D deficiency, unspecified: Secondary | ICD-10-CM

## 2018-03-01 NOTE — Progress Notes (Signed)
Rechecking his Vit D level after starting therapy for Vit D deficiency

## 2018-03-03 ENCOUNTER — Telehealth: Payer: Self-pay | Admitting: *Deleted

## 2018-03-03 NOTE — Telephone Encounter (Signed)
-----   Message from Nuala Alpha, DO sent at 03/01/2018  2:17 PM EDT ----- Regarding: Vit D-2 needs to be checked on patient Hey Danne Scardina! I tried to send this to the [facility] but it wasn't working. Anyway, can we call Mr. Keene Gilkey and let him know I want him to come in just for blood work? I want to check his Vit D level. Let me know if we get in touch with him. Thanks!  Tim

## 2018-03-03 NOTE — Telephone Encounter (Signed)
Pt informed. Deseree Blount, CMA  

## 2018-03-06 ENCOUNTER — Other Ambulatory Visit: Payer: BLUE CROSS/BLUE SHIELD

## 2018-03-06 DIAGNOSIS — E559 Vitamin D deficiency, unspecified: Secondary | ICD-10-CM

## 2018-03-07 ENCOUNTER — Ambulatory Visit (AMBULATORY_SURGERY_CENTER): Payer: BLUE CROSS/BLUE SHIELD | Admitting: Gastroenterology

## 2018-03-07 ENCOUNTER — Other Ambulatory Visit: Payer: Self-pay

## 2018-03-07 ENCOUNTER — Encounter: Payer: Self-pay | Admitting: Gastroenterology

## 2018-03-07 VITALS — BP 106/66 | HR 73 | Temp 98.6°F | Resp 18 | Ht 67.0 in | Wt 202.0 lb

## 2018-03-07 DIAGNOSIS — K598 Other specified functional intestinal disorders: Secondary | ICD-10-CM | POA: Diagnosis not present

## 2018-03-07 DIAGNOSIS — Z1212 Encounter for screening for malignant neoplasm of rectum: Secondary | ICD-10-CM | POA: Diagnosis not present

## 2018-03-07 DIAGNOSIS — D124 Benign neoplasm of descending colon: Secondary | ICD-10-CM | POA: Diagnosis not present

## 2018-03-07 DIAGNOSIS — Z1211 Encounter for screening for malignant neoplasm of colon: Secondary | ICD-10-CM

## 2018-03-07 LAB — VITAMIN D 25 HYDROXY (VIT D DEFICIENCY, FRACTURES): Vit D, 25-Hydroxy: 26.8 ng/mL — ABNORMAL LOW (ref 30.0–100.0)

## 2018-03-07 MED ORDER — SODIUM CHLORIDE 0.9 % IV SOLN
500.0000 mL | Freq: Once | INTRAVENOUS | Status: AC
Start: 2018-03-07 — End: ?

## 2018-03-07 NOTE — Progress Notes (Signed)
Pt's states no medical or surgical changes since previsit or office visit. 

## 2018-03-07 NOTE — Patient Instructions (Signed)
YOU HAD AN ENDOSCOPIC PROCEDURE TODAY AT THE [facility]:   Refer to the procedure report that was given to you for any specific questions about what was found during the examination.  If the procedure report does not answer your questions, please call your gastroenterologist to clarify.  If you requested that your care partner not be given the details of your procedure findings, then the procedure report has been included in a sealed envelope for you to review at your convenience later.  YOU SHOULD EXPECT: Some feelings of bloating in the abdomen. Passage of more gas than usual.  Walking can help get rid of the air that was put into your GI tract during the procedure and reduce the bloating. If you had a lower endoscopy (such as a colonoscopy or flexible sigmoidoscopy) you may notice spotting of blood in your stool or on the toilet paper. If you underwent a bowel prep for your procedure, you may not have a normal bowel movement for a few days.  Please Note:  You might notice some irritation and congestion in your nose or some drainage.  This is from the oxygen used during your procedure.  There is no need for concern and it should clear up in a day or so.  SYMPTOMS TO REPORT IMMEDIATELY:   Following lower endoscopy (colonoscopy or flexible sigmoidoscopy):  Excessive amounts of blood in the stool  Significant tenderness or worsening of abdominal pains  Swelling of the abdomen that is new, acute  Fever of 100F or higher  Please see handouts on polyps, diverticulosis, and hemorrhoids.  For urgent or emergent issues, a gastroenterologist can be reached at any hour by calling [phone].   DIET:  We do recommend a small meal at first, but then you may proceed to your regular diet.  Drink plenty of fluids but you should avoid alcoholic beverages for 24 hours.  ACTIVITY:  You should plan to take it easy for the rest of today and you should NOT DRIVE or use heavy machinery until  tomorrow (because of the sedation medicines used during the test).    FOLLOW UP: Our staff will call the number listed on your records the next business day following your procedure to check on you and address any questions or concerns that you may have regarding the information given to you following your procedure. If we do not reach you, we will leave a message.  However, if you are feeling well and you are not experiencing any problems, there is no need to return our call.  We will assume that you have returned to your regular daily activities without incident.  If any biopsies were taken you will be contacted by phone or by letter within the next 1-3 weeks.  Please call us at [phone] if you have not heard about the biopsies in 3 weeks.    SIGNATURES/CONFIDENTIALITY: You and/or your care partner have signed paperwork which will be entered into your electronic medical record.  These signatures attest to the fact that that the information above on your After Visit Summary has been reviewed and is understood.  Full responsibility of the confidentiality of this discharge information lies with you and/or your care-partner.   Thank you for allowing us to provide your healthcare today.  

## 2018-03-07 NOTE — Progress Notes (Signed)
Report to PACU, RN, vss, BBS= Clear.  

## 2018-03-07 NOTE — Op Note (Signed)
[facility] Patient Name: Preston Sullivan Procedure Date: 03/07/2018 7:51 AM MRN: [id number] Endoscopist: Remo Lipps P. Rhiannan Kievit MD, MD Age: 52 Referring MD:  Date of Birth: 1966-06-25 Gender: Male Account #: [id number] Procedure:                Colonoscopy Indications:              Screening for colorectal malignant neoplasm, This                            is the patient's first colonoscopy Medicines:                Monitored Anesthesia Care Procedure:                Pre-Anesthesia Assessment:                           - Prior to the procedure, a History and Physical                            was performed, and patient medications and                            allergies were reviewed. The patient's tolerance of                            previous anesthesia was also reviewed. The risks                            and benefits of the procedure and the sedation                            options and risks were discussed with the patient.                            All questions were answered, and informed consent                            was obtained. Prior Anticoagulants: The patient has                            taken no previous anticoagulant or antiplatelet                            agents. ASA Grade Assessment: II - A patient with                            mild systemic disease. After reviewing the risks                            and benefits, the patient was deemed in                            satisfactory condition to undergo the procedure.  After obtaining informed consent, the colonoscope                            was passed under direct vision. Throughout the                            procedure, the patient's blood pressure, pulse, and                            oxygen saturations were monitored continuously. The                            Colonoscope was introduced through the anus and                            advanced to the the  cecum, identified by                            appendiceal orifice and ileocecal valve. The                            colonoscopy was performed without difficulty. The                            patient tolerated the procedure well. The quality                            of the bowel preparation was good. The ileocecal                            valve, appendiceal orifice, and rectum were                            photographed. Scope In: 7:57:05 AM Scope Out: 8:13:06 AM Scope Withdrawal Time: 0 hours 13 minutes 41 seconds  Total Procedure Duration: 0 hours 16 minutes 1 second  Findings:                 The perianal and digital rectal examinations were                            normal.                           A 3 mm polyp was found in the descending colon. The                            polyp was sessile. The polyp was removed with a                            cold snare. Resection and retrieval were complete.                           Scattered small-mouthed diverticula were found in  the transverse colon.                           Internal hemorrhoids were found during retroflexion.                           The exam was otherwise without abnormality. Complications:            No immediate complications. Estimated blood loss:                            Minimal. Estimated Blood Loss:     Estimated blood loss was minimal. Impression:               - One 3 mm polyp in the descending colon, removed                            with a cold snare. Resected and retrieved.                           - Diverticulosis in the transverse colon.                           - Internal hemorrhoids.                           - The examination was otherwise normal. Recommendation:           - Patient has a contact number available for                            emergencies. The signs and symptoms of potential                            delayed complications were discussed  with the                            patient. Return to normal activities tomorrow.                            Written discharge instructions were provided to the                            patient.                           - Resume previous diet.                           - Continue present medications.                           - Await pathology results.                           - Repeat colonoscopy is recommended for  surveillance. The colonoscopy date will be                            determined after pathology results from today's                            exam become available for review. Remo Lipps P. Herb Beltre MD, MD 03/07/2018 8:16:40 AM This report has been signed electronically.

## 2018-03-07 NOTE — Progress Notes (Signed)
Called to room to assist during endoscopic procedure.  Patient ID and intended procedure confirmed with present staff. Received instructions for my participation in the procedure from the performing physician.  

## 2018-03-10 ENCOUNTER — Telehealth: Payer: Self-pay

## 2018-03-10 NOTE — Telephone Encounter (Signed)
  Follow up Call-  Call back number 03/07/2018  Post procedure Call Back phone  # [phone]  Permission to leave phone message Yes  Some recent data might be hidden     Patient questions:  Do you have a fever, pain , or abdominal swelling? No. Pain Score  0 *  Have you tolerated food without any problems? Yes.    Have you been able to return to your normal activities? Yes.    Do you have any questions about your discharge instructions: Diet   No. Medications  No. Follow up visit  No.  Do you have questions or concerns about your Care? No.  Actions: * If pain score is 4 or above: No action needed, pain <4.

## 2018-03-12 ENCOUNTER — Encounter: Payer: Self-pay | Admitting: Gastroenterology

## 2018-03-27 ENCOUNTER — Ambulatory Visit: Payer: Self-pay | Admitting: Family Medicine

## 2018-04-01 ENCOUNTER — Emergency Department (HOSPITAL_BASED_OUTPATIENT_CLINIC_OR_DEPARTMENT_OTHER)
Admission: EM | Admit: 2018-04-01 | Discharge: 2018-04-02 | Disposition: A | Discharge status: Home / Self Care | Payer: BLUE CROSS/BLUE SHIELD | Attending: Emergency Medicine | Admitting: Emergency Medicine

## 2018-04-01 ENCOUNTER — Encounter (HOSPITAL_BASED_OUTPATIENT_CLINIC_OR_DEPARTMENT_OTHER): Payer: Self-pay | Admitting: *Deleted

## 2018-04-01 ENCOUNTER — Other Ambulatory Visit: Payer: Self-pay

## 2018-04-01 DIAGNOSIS — R1032 Left lower quadrant pain: Secondary | ICD-10-CM

## 2018-04-01 DIAGNOSIS — Z87891 Personal history of nicotine dependence: Secondary | ICD-10-CM | POA: Insufficient documentation

## 2018-04-01 DIAGNOSIS — Z79899 Other long term (current) drug therapy: Secondary | ICD-10-CM | POA: Diagnosis not present

## 2018-04-01 LAB — URINALYSIS, ROUTINE W REFLEX MICROSCOPIC
Bilirubin Urine: NEGATIVE
Glucose, UA: NEGATIVE mg/dL
Hgb urine dipstick: NEGATIVE
Ketones, ur: NEGATIVE mg/dL
Leukocytes, UA: NEGATIVE
Nitrite: NEGATIVE
Protein, ur: NEGATIVE mg/dL
Specific Gravity, Urine: 1.02 (ref 1.005–1.030)
pH: 6.5 (ref 5.0–8.0)

## 2018-04-01 NOTE — ED Triage Notes (Signed)
Dysuria. Denies frequency or scanty urination. Pain in his lower abdomen that feels like pressure.

## 2018-04-02 ENCOUNTER — Emergency Department (HOSPITAL_BASED_OUTPATIENT_CLINIC_OR_DEPARTMENT_OTHER): Payer: BLUE CROSS/BLUE SHIELD

## 2018-04-02 DIAGNOSIS — R1032 Left lower quadrant pain: Secondary | ICD-10-CM | POA: Diagnosis not present

## 2018-04-02 LAB — COMPREHENSIVE METABOLIC PANEL
ALT: 23 U/L (ref 17–63)
AST: 17 U/L (ref 15–41)
Albumin: 4.2 g/dL (ref 3.5–5.0)
Alkaline Phosphatase: 70 U/L (ref 38–126)
Anion gap: 9 (ref 5–15)
BUN: 13 mg/dL (ref 6–20)
CO2: 24 mmol/L (ref 22–32)
Calcium: 8.9 mg/dL (ref 8.9–10.3)
Chloride: 103 mmol/L (ref 101–111)
Creatinine, Ser: 0.63 mg/dL (ref 0.61–1.24)
GFR calc Af Amer: >60 mL/min (ref >60)
GFR calc non Af Amer: >60 mL/min (ref >60)
Glucose, Bld: 103 mg/dL — ABNORMAL HIGH (ref 65–99)
Potassium: 3.6 mmol/L (ref 3.5–5.1)
Sodium: 136 mmol/L (ref 135–145)
Total Bilirubin: 0.4 mg/dL (ref 0.3–1.2)
Total Protein: 7.6 g/dL (ref 6.5–8.1)

## 2018-04-02 LAB — CBC WITH DIFFERENTIAL/PLATELET
Basophils Absolute: 0 10*3/uL (ref 0.0–0.1)
Basophils Relative: 1 %
Eosinophils Absolute: 0.2 10*3/uL (ref 0.0–0.7)
Eosinophils Relative: 2 %
HCT: 40.9 % (ref 39.0–52.0)
Hemoglobin: 13.9 g/dL (ref 13.0–17.0)
Lymphocytes Relative: 32 %
Lymphs Abs: 2.7 10*3/uL (ref 0.7–4.0)
MCH: 30.3 pg (ref 26.0–34.0)
MCHC: 34 g/dL (ref 30.0–36.0)
MCV: 89.1 fL (ref 78.0–100.0)
Monocytes Absolute: 1 10*3/uL (ref 0.1–1.0)
Monocytes Relative: 11 %
Neutro Abs: 4.7 10*3/uL (ref 1.7–7.7)
Neutrophils Relative %: 54 %
Platelets: 218 10*3/uL (ref 150–400)
RBC: 4.59 MIL/uL (ref 4.22–5.81)
RDW: 14.5 % (ref 11.5–15.5)
WBC: 8.6 10*3/uL (ref 4.0–10.5)

## 2018-04-02 LAB — LIPASE, BLOOD: Lipase: 21 U/L (ref 11–51)

## 2018-04-02 MED ORDER — HYDROCODONE-ACETAMINOPHEN 5-325 MG PO TABS: 1.0000 | ORAL_TABLET | ORAL | 0 refills | Status: DC | PRN

## 2018-04-02 MED ORDER — IOPAMIDOL (ISOVUE-300) INJECTION 61%
100.0000 mL | Freq: Once | INTRAVENOUS | Status: AC | PRN
  Administered 2018-04-02: 100 mL via INTRAVENOUS

## 2018-04-02 MED ORDER — ONDANSETRON HCL 4 MG/2ML IJ SOLN
4.0000 mg | Freq: Once | INTRAMUSCULAR | Status: AC
  Administered 2018-04-02: 4 mg via INTRAVENOUS
  Filled 2018-04-02: qty 2

## 2018-04-02 MED ORDER — FENTANYL CITRATE (PF) 100 MCG/2ML IJ SOLN
100.0000 ug | INTRAMUSCULAR | Status: DC | PRN
  Administered 2018-04-02: 100 ug via INTRAVENOUS
  Filled 2018-04-02: qty 2

## 2018-04-02 MED ORDER — SODIUM CHLORIDE 0.9 % IV SOLN
Freq: Once | INTRAVENOUS | Status: AC
Start: 2018-04-02 — End: 2018-04-02
  Administered 2018-04-02: 01:00:00 via INTRAVENOUS

## 2018-04-02 NOTE — ED Provider Notes (Signed)
[facility] Provider Note: Georgena Spurling, MD, FACEP  CSN: [id number] MRN: [id number] ARRIVAL: 04/01/18 at 2107 ROOM: [place]  Abdominal Pain   HISTORY OF PRESENT ILLNESS  04/02/18 12:19 AM Preston Sullivan is a 52 y.o. male who developed left lower quadrant abdominal pain about 2 days ago.  The onset has been gradual.  He rates the pain as a 10 out of 10 at its worse.  Pain is worse when standing, walking or moving his left leg.  It is improved when lying still and supine.  It is somewhat worse with urinating as well.  The pain is sharp and fairly well localized.  There is no pain in his left testicle and he has not felt a mass in his left groin.  He denies fever although his temperature was noted to be 99 on arrival.  He has had no nausea or vomiting.  He has had diarrhea yesterday.  It was nonbloody.  Consultation with the [facility] state controlled substances database reveals the patient has received no opioid prescriptions in the past 2 years.   Past Medical History:  Diagnosis Date  . Arthritis   . GERD (gastroesophageal reflux disease)     Past Surgical History:  Procedure Laterality Date  . APPENDECTOMY      Family History  Problem Relation Age of Onset  . Colon cancer Neg Hx   . Esophageal cancer Neg Hx   . Liver cancer Neg Hx   . Pancreatic cancer Neg Hx   . Rectal cancer Neg Hx   . Stomach cancer Neg Hx     Social History   Tobacco Use  . Smoking status: Former Smoker    Packs/day: 1.00    Years: 42.00    Pack years: 42.00    Types: Cigarettes    Start date: 12/11/1975    Last attempt to quit: 12/30/2017    Years since quitting: 0.2  . Smokeless tobacco: Never Used  Substance Use Topics  . Alcohol use: Yes    Comment: soc  . Drug use: Yes    Types: Marijuana    Prior to Admission medications   Medication Sig Start Date End Date Taking? Authorizing Provider  acetaminophen (TYLENOL) 500 MG tablet Take 1,000 mg by  mouth every 6 (six) hours as needed for moderate pain.    [provider]  albuterol (PROVENTIL HFA;VENTOLIN HFA) 108 (90 Base) MCG/ACT inhaler Inhale 2 puffs into the lungs every 4 (four) hours as needed for wheezing or shortness of breath. 11/11/17   Joy, Shawn C, PA-C  CHANTIX 0.5 MG tablet Take 0.5 mg by mouth 2 (two) times daily. 12/24/17   [provider]  Multiple Vitamin (MULTIVITAMIN) tablet Take 1 tablet daily by mouth.    [provider]  naproxen (EC NAPROSYN) 500 MG EC tablet Take 1 tablet (500 mg total) by mouth 2 (two) times daily with a meal. 02/11/18   Burky, Malachy Moan, NP  ranitidine (ZANTAC) 150 MG tablet Take 150 mg by mouth 2 (two) times daily.    [provider]    Allergies Patient has no known allergies.   REVIEW OF SYSTEMS  Negative except as noted here or in the History of Present Illness.   PHYSICAL EXAMINATION  Initial Vital Signs Blood pressure 114/66, pulse 64, temperature 99 F (37.2 C), temperature source Oral, resp. rate 16, height 5\' 7"  (1.702 m), weight 91.6 kg (202 lb), SpO2 100 %.  Examination General:  Well-developed, well-nourished male in no acute distress; appearance consistent with age of record HENT: normocephalic; atraumatic Eyes: pupils equal, round and reactive to light; extraocular muscles intact; arcus senilis bilaterally Neck: supple Heart: regular rate and rhythm Lungs: clear to auscultation bilaterally Abdomen: soft; nondistended; left lower quadrant tenderness with pain on movement of the left leg; no masses or hepatosplenomegaly; bowel sounds present GU: Tanner V male; no testicular mass or tenderness; no hernia palpated Extremities: No deformity; full range of motion; pulses normal Neurologic: Awake, alert and oriented; motor function intact in all extremities and symmetric; no facial droop Skin: Warm and dry Psychiatric: Normal mood and affect   RESULTS  Summary of this visit's results, reviewed  by myself:   EKG Interpretation  Date/Time:    Ventricular Rate:    PR Interval:    QRS Duration:   QT Interval:    QTC Calculation:   R Axis:     Text Interpretation:        Laboratory Studies: Results for orders placed or performed during the hospital encounter of 04/01/18 (from the past 24 hour(s))  Urinalysis, Routine w reflex microscopic     Status: None   Collection Time: 04/01/18  9:54 PM  Result Value Ref Range   Color, Urine YELLOW YELLOW   APPearance CLEAR CLEAR   Specific Gravity, Urine 1.020 1.005 - 1.030   pH 6.5 5.0 - 8.0   Glucose, UA NEGATIVE NEGATIVE mg/dL   Hgb urine dipstick NEGATIVE NEGATIVE   Bilirubin Urine NEGATIVE NEGATIVE   Ketones, ur NEGATIVE NEGATIVE mg/dL   Protein, ur NEGATIVE NEGATIVE mg/dL   Nitrite NEGATIVE NEGATIVE   Leukocytes, UA NEGATIVE NEGATIVE  Lipase, blood     Status: None   Collection Time: 04/02/18 12:48 AM  Result Value Ref Range   Lipase 21 11 - 51 U/L  Comprehensive metabolic panel     Status: Abnormal   Collection Time: 04/02/18 12:48 AM  Result Value Ref Range   Sodium 136 135 - 145 mmol/L   Potassium 3.6 3.5 - 5.1 mmol/L   Chloride 103 101 - 111 mmol/L   CO2 24 22 - 32 mmol/L   Glucose, Bld 103 (H) 65 - 99 mg/dL   BUN 13 6 - 20 mg/dL   Creatinine, Ser 0.63 0.61 - 1.24 mg/dL   Calcium 8.9 8.9 - 10.3 mg/dL   Total Protein 7.6 6.5 - 8.1 g/dL   Albumin 4.2 3.5 - 5.0 g/dL   AST 17 15 - 41 U/L   ALT 23 17 - 63 U/L   Alkaline Phosphatase 70 38 - 126 U/L   Total Bilirubin 0.4 0.3 - 1.2 mg/dL   GFR calc non Af Amer >60 >60 mL/min   GFR calc Af Amer >60 >60 mL/min   Anion gap 9 5 - 15  CBC with Differential/Platelet     Status: None   Collection Time: 04/02/18 12:48 AM  Result Value Ref Range   WBC 8.6 4.0 - 10.5 K/uL   RBC 4.59 4.22 - 5.81 MIL/uL   Hemoglobin 13.9 13.0 - 17.0 g/dL   HCT 40.9 39.0 - 52.0 %   MCV 89.1 78.0 - 100.0 fL   MCH 30.3 26.0 - 34.0 pg   MCHC 34.0 30.0 - 36.0 g/dL   RDW 14.5 11.5 - 15.5 %     Platelets 218 150 - 400 K/uL   Neutrophils Relative % 54 %   Neutro Abs 4.7 1.7 - 7.7 K/uL   Lymphocytes Relative 32 %  Lymphs Abs 2.7 0.7 - 4.0 K/uL   Monocytes Relative 11 %   Monocytes Absolute 1.0 0.1 - 1.0 K/uL   Eosinophils Relative 2 %   Eosinophils Absolute 0.2 0.0 - 0.7 K/uL   Basophils Relative 1 %   Basophils Absolute 0.0 0.0 - 0.1 K/uL   Imaging Studies: Ct Abdomen Pelvis W Contrast  Result Date: 04/02/2018 CLINICAL DATA:  Left lower quadrant and groin pain and pressure for 4 days. EXAM: CT ABDOMEN AND PELVIS WITH CONTRAST TECHNIQUE: Multidetector CT imaging of the abdomen and pelvis was performed using the standard protocol following bolus administration of intravenous contrast. CONTRAST:  188mL ISOVUE-300 IOPAMIDOL (ISOVUE-300) INJECTION 61% COMPARISON:  12/28/2017 FINDINGS: Lower chest: Atelectasis in the lung bases. Hepatobiliary: No focal liver abnormality is seen. No gallstones, gallbladder wall thickening, or biliary dilatation. Pancreas: Unremarkable. No pancreatic ductal dilatation or surrounding inflammatory changes. Spleen: Normal in size without focal abnormality. Adrenals/Urinary Tract: Adrenal glands are unremarkable. Kidneys are normal, without renal calculi, focal lesion, or hydronephrosis. Bladder is unremarkable. Stomach/Bowel: Stomach is within normal limits. Appendix is surgically absent. No evidence of bowel wall thickening, distention, or inflammatory changes. Vascular/Lymphatic: Aortic atherosclerosis. No enlarged abdominal or pelvic lymph nodes. Reproductive: Prostate is unremarkable. Other: No abdominal wall hernia or abnormality. No abdominopelvic ascites. Musculoskeletal: Degenerative changes in the spine. No destructive bone lesions. IMPRESSION: No acute process demonstrated in the abdomen or pelvis. No evidence of bowel obstruction or inflammation. Aortic atherosclerosis. Electronically Signed   By: Lucienne Capers M.D.   On: 04/02/2018 02:07    ED  COURSE and MDM  Nursing notes and initial vitals signs, including pulse oximetry, reviewed.  Vitals:   04/01/18 2115 04/01/18 2116 04/02/18 0008 04/02/18 0214  BP:  124/79 114/66 120/75  Pulse:  79 64 69  Resp:  20 16 18   Temp:  99 F (37.2 C)    TempSrc:  Oral    SpO2:  100% 100% 100%  Weight: 91.6 kg (202 lb)     Height: 5\' 7"  (1.702 m)      2:21 AM Patient advised of reassuring lab work and CT scan.  Patient does lift at work every day and this may represent acute musculoskeletal strain.  We will give him 2 days of rest and treat his pain.  He was advised to return if symptoms worsen or change as this could represent early diverticulitis.  PROCEDURES    ED DIAGNOSES     ICD-10-CM   1. Left lower quadrant pain R10.32        Shanon Rosser, MD 04/02/18 [phone]

## 2018-04-02 NOTE — ED Notes (Signed)
ED Provider at bedside.  Dr. Florina Ou said to start protochol

## 2018-04-07 ENCOUNTER — Encounter: Payer: Self-pay | Admitting: Student

## 2018-04-07 ENCOUNTER — Ambulatory Visit (INDEPENDENT_AMBULATORY_CARE_PROVIDER_SITE_OTHER): Payer: BLUE CROSS/BLUE SHIELD | Admitting: Student

## 2018-04-07 ENCOUNTER — Other Ambulatory Visit: Payer: Self-pay

## 2018-04-07 VITALS — BP 114/66 | HR 63 | Temp 99.0°F | Ht 67.0 in | Wt 193.0 lb

## 2018-04-07 DIAGNOSIS — S76219A Strain of adductor muscle, fascia and tendon of unspecified thigh, initial encounter: Secondary | ICD-10-CM | POA: Diagnosis not present

## 2018-04-07 DIAGNOSIS — F1290 Cannabis use, unspecified, uncomplicated: Secondary | ICD-10-CM | POA: Diagnosis not present

## 2018-04-07 DIAGNOSIS — R10824 Left lower quadrant rebound abdominal tenderness: Secondary | ICD-10-CM | POA: Diagnosis not present

## 2018-04-07 MED ORDER — CYCLOBENZAPRINE HCL 10 MG PO TABS: 10.0000 mg | ORAL_TABLET | Freq: Every day | ORAL | 0 refills | Status: DC

## 2018-04-07 NOTE — Progress Notes (Signed)
Subjective:    Preston Sullivan is a 52 y.o. old male here for left groin pain  HPI Left groin pain: this has been going on for two weeks. 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 He describes the pain as muscle tightness. He thinks there is swelling in his groin but not sure about this. Worse with walking and lifting. Reports stiffness when he gets up from prolonged sitting.He asks for doctor's letter for his work.  Denies bowel and bladder issue. Denies fever or chills. Denies numbness or tingling in her legs. Denies blood in stool.  Patient was seen in ED for LLQ pain about 5 days ago. Today, he says his pain is in his groin and over medial aspect of his thighs. Work-up in ED including CMP, CBC and CT abdomen and pelvis within normal limits.  Reports smoking about 0.5 pack of cigar a day. Admits occasional alcohol. Reports smoking marijuana everyday.   PMH/Problem List: has Vitamin D deficiency; GLUCOSE INTOLERANCE; TOBACCO USER; DEPRESSION; ALLERGIC RHINITIS; DENTAL CARIES; GERD; LIVER PAIN; SHOULDER PAIN, BILATERAL; Backache; Unstable angina ([place]); Other emphysema ([place]); Epididymitis; Healthcare maintenance; Marijuana use, continuous; Left lower quadrant abdominal tenderness with rebound tenderness; and Strain of adductor muscle of thigh on their problem list.   has a past medical history of Arthritis and GERD (gastroesophageal reflux disease).  FH:  Family History  Problem Relation Age of Onset  . Colon cancer Neg Hx   . Esophageal cancer Neg Hx   . Liver cancer Neg Hx   . Pancreatic cancer Neg Hx   . Rectal cancer Neg Hx   . Stomach cancer Neg Hx     SH Social History   Tobacco Use  . Smoking status: Former Smoker    Packs/day: 1.00    Years: 42.00    Pack years: 42.00    Types: Cigarettes    Start date: 12/11/1975    Last attempt to quit: 12/30/2017    Years since quitting: 0.2  . Smokeless tobacco: Never Used  Substance Use Topics  . Alcohol use: Yes   Comment: soc  . Drug use: Yes    Types: Marijuana    Review of Systems Review of systems negative except for pertinent positives and negatives in history of present illness above.     Objective:     Vitals:   04/07/18 1102  BP: 114/66  Pulse: 63  Temp: 99 F (37.2 C)  TempSrc: Oral  SpO2: 99%  Weight: 193 lb (87.5 kg)  Height: 5\' 7"  (1.702 m)   Body mass index is 30.23 kg/m.  Physical Exam  GEN: appears well & comfortable. No apparent distress. HEM: negative for cervical or periauricular lymphadenopathies CVS: RRR, nl s1 & s2, no murmurs, no edema RESP: no IWOB, good air movement bilaterally, CTAB GI: BS present & normal, soft.  Tenderness over LLQ.  No rebound or guarding.  No palpable mass. GU: No scrotal swelling or tenderness. MSK:  Lower extremity -Symmetric.  No notable atrophy, swelling overlying the skin color change. -Tenderness to palp patient over proximal and medial aspect of his thighs bilaterally - No inguinal hernia with Valsalva.  Also tenderness over LLQ.  No rebound tenderness. - Pain in his groin bilaterally with resisted adduction, abduction and hip flexion.  Otherwise, motor strength is within normal limits without pain. - Mild FABER and negative FADIR - Light sensation intact in all dermatomes. - Patellar reflex 2+ in right and 1+ in left - Achille reflex 1+  bilaterally   SKIN: no apparent skin lesion NEURO: As above PSYCH: euthymic mood with congruent affect    Assessment and Plan:  1. Strain of adductor muscle of thigh: history and exam suggestive for adductor muscle strain.  Exam with tenderness to palpation over proximal and medial aspect of his thighs bilaterally.  He also have pain in his groin bilaterally with resisted abduction, abduction and hip flexion.  GU exam within normal limits.  He has no palpable hernia with Valsalva.  FABER and FADIR are not impressive. - Ambulatory referral to Physical Therapy for pelvic floor rehab -  cyclobenzaprine (FLEXERIL) 10 MG tablet; Take 1 tablet (10 mg total) by mouth at bedtime.  Dispense: 30 tablet; Refill: 0 -Gave him letter with his lifting restriction to less than 25 pounds for two to three weeks  2. Left lower quadrant abdominal tenderness with rebound tenderness - Likely related to the above.  Recent work-up in the ED including CT abdomen and pelvis benign.  He has no GI or GU symptoms. He had colonoscopy about a month ago that showed a 3 mm polyp in descending colon that was resected, and diverticulosis in the transverse colon.  3. Marijuana use, continuous - Recommended quitting or cutting down on this.   Return if symptoms worsen or fail to improve.  Mercy Riding, MD 04/07/18 Pager: [phone]

## 2018-04-07 NOTE — Patient Instructions (Addendum)
It was great seeing you today! We have addressed the following issues today  Groin pain: This is likely due to muscle strain.  We gave you a prescription for muscle relaxer.  Please avoid driving or operating machinery within 8 to 9 hours of taking this medication.  We also send a referral to physical therapy.  Someone will get in touch with you about this referral in the next 2 to 3 weeks.     If we did any lab work today, and the results require attention, either me or my nurse will get in touch with you. If everything is normal, you will get a letter in mail and a message via . If you don't hear from [place] in two weeks, please give [place] a call. Otherwise, we look forward to seeing you again at your next visit. If you have any questions or concerns before then, please call the clinic at [phone].  Please bring all your medications to every doctors visit  Sign up for My Chart to have easy access to your labs results, and communication with your Primary care physician.    Please check-out at the front desk before leaving the clinic.    Take Care,   Dr. Cyndia Skeeters   Adductor Muscle Strain An adductor muscle strain, also called a groin strain or pull, is an injury to the muscles or tendon on the upper inner part of the thigh. These muscles are called the adductor muscles or groin muscles. They are responsible for moving the leg across the body or pulling the legs together. A muscle strain occurs when a muscle is overstretched and some muscle fibers are torn. An adductor muscle strain can range from mild to severe, depending on how many muscle fibers are affected and whether the muscle fibers are partially or completely torn. Adductor muscle strains usually occur during exercise or participation in sports. The injury often happens when a sudden, violent force is placed on a muscle, stretching the muscle too far. A strain is more likely to occur when your muscles are not warmed up or if you are not  properly conditioned. Depending on the severity of the muscle strain, recovery time may vary from a few weeks to several weeks. Severe injuries often require 4-6 weeks for recovery. In those cases, complete healing can take 4-5 months. What are the causes? This injury may be caused by:  Stretching the adductor muscles too far or too suddenly, often during side-to-side motion with an abrupt change in direction.  Putting repeated stress on the adductor muscles over a long period of time.  Performing vigorous activity without properly stretching the adductor muscles beforehand.  What are the signs or symptoms? Symptoms of this condition include:  Pain and tenderness in the groin area. This begins as sharp pain and persists as a dull ache.  A popping or snapping feeling when the injury occurs (for severe strains).  Swelling or bruising.  Muscle spasms.  Weakness in the leg.  Stiffness in the groin area with decreased ability to move the affected muscles.  How is this diagnosed? This condition may be diagnosed based on your symptoms, your description of how the injury occurred, and a physical exam. X-rays are sometimes needed to rule out a broken bone or cartilage problems. A CT scan or MRI may be done if your health care provider suspects a complete muscle tear or needs to check for other injuries. How is this treated? An adductor strain will often heal on  its own. Typically, treatment will involve protecting the injured area, rest, ice, pressure (compression), and elevation. This is often called PRICE therapy. Your health care provider may also prescribe medicines to help manage pain and swelling (anti-inflammatory medicine). You may be told to use crutches for the first few days to minimize your pain. Follow these instructions at home: [place] the muscle from being injured again.  Rest. Do not use the strained muscle if it causes pain.  If directed, apply ice to the  injured area: ? Put ice in a plastic bag. ? Place a towel between your skin and the bag. ? Leave the ice on for 20 minutes, 2-3 times a day. Do this for the first 2 days after the injury.  Apply compression by wrapping the injured area with an elastic bandage as told by your health care provider.  Raise (elevate) the injured area above the level of your heart while you are sitting or lying down. General instructions  Take over-the-counter and prescription medicines only as told by your health care provider.  Walk, stretch, and do exercises as told by your health care provider. Only do these activities if you can do so without any pain. How is this prevented?  Warm up and stretch before being active.  Cool down and stretch after being active.  Give your body time to rest between periods of activity.  Make sure to use equipment that fits you.  Be safe and responsible while being active to avoid slips and falls.  Maintain physical fitness, including: ? Proper conditioning in the adductor muscles. ? Overall strength, flexibility, and endurance. Contact a health care provider if:  You have increased pain or swelling in the affected area.  Your symptoms are not improving or they are getting worse. This information is not intended to replace advice given to you by your health care provider. Make sure you discuss any questions you have with your health care provider. Document Released: 07/24/2004 Document Revised: 06/15/2016 Document Reviewed: 08/30/2015 Elsevier Interactive Patient Education  2018 [employer].

## 2018-04-10 ENCOUNTER — Ambulatory Visit: Payer: Self-pay | Admitting: Family Medicine

## 2018-04-10 ENCOUNTER — Other Ambulatory Visit: Payer: Self-pay

## 2018-04-10 ENCOUNTER — Encounter: Payer: Self-pay | Admitting: Family Medicine

## 2018-04-10 ENCOUNTER — Ambulatory Visit (INDEPENDENT_AMBULATORY_CARE_PROVIDER_SITE_OTHER): Payer: BLUE CROSS/BLUE SHIELD | Admitting: Family Medicine

## 2018-04-10 VITALS — BP 102/64 | HR 100 | Temp 99.0°F | Wt 196.0 lb

## 2018-04-10 DIAGNOSIS — S76219A Strain of adductor muscle, fascia and tendon of unspecified thigh, initial encounter: Secondary | ICD-10-CM

## 2018-04-10 DIAGNOSIS — E559 Vitamin D deficiency, unspecified: Secondary | ICD-10-CM

## 2018-04-10 DIAGNOSIS — F17200 Nicotine dependence, unspecified, uncomplicated: Secondary | ICD-10-CM | POA: Diagnosis not present

## 2018-04-10 MED ORDER — CHANTIX 0.5 MG PO TABS: 0.5000 mg | ORAL_TABLET | Freq: Two times a day (BID) | ORAL | 2 refills | Status: DC

## 2018-04-10 MED ORDER — NICOTINE 14 MG/24HR TD PT24: 14.0000 mg | MEDICATED_PATCH | Freq: Every day | TRANSDERMAL | 2 refills | Status: DC

## 2018-04-10 NOTE — Assessment & Plan Note (Signed)
Improved with ibuprofen and flexeril. Cont stretching.  Gave letter to return to work. Asked patient to limit his lifting to under 25lbs per item at a time for the next 2 weeks.

## 2018-04-10 NOTE — Patient Instructions (Addendum)
It was great to se you today! Thank you for letting me participate in your care!  Today, we discussed your improved LLQ pain. Please continue to do stretches and exercises and please follow up with physical therapy. I will give you a return to work letter today. Please try to limit the weight you pick up to less than 25lbs at one time.  I have refilled your Chantix and Nicoderm CQ patch to help you continue in quitting smoking. Please use the 1-800-QUITNOW hotline for further assistance.  I will recheck your Vit D levels and call you or send you a letter with the results.  Be well, Harolyn Rutherford, DO PGY-1, [facility]

## 2018-04-10 NOTE — Assessment & Plan Note (Signed)
Patient was slightly below normal levels at last check after finishing 50,000U of Vit-D2 for 8 weeks. Has been taking OTC multivitamin with Vit D.   Recheck today and if still below normal levels will contact patient and instruct him to take Vit D OTC at 800U per day and will recheck in several months.

## 2018-04-10 NOTE — Assessment & Plan Note (Signed)
Refilled Chantix and Nicoderm CQ patch. Patient states he feels like they are helping him.  Encouraged patient to continue cutting back and trying to quit completely. Gave info on [employer].

## 2018-04-10 NOTE — Progress Notes (Signed)
Subjective: No chief complaint on file.    HPI: Preston Sullivan is a 52 y.o. presenting to clinic today to discuss the following:  Smoking Cessation Patient is still smoking but still motivated to quit and states he has been smoking less. Smoking only cigars now. He is requesting a refill of his Chantix b/c he ran out and a refill of nicotine patches. Encouraged him to continue to cut back and to get additional help with the quitnow hotline if he needs it.  Vitamin D deficiency Patient had a Vit D level of 26.8 at last check approximately one month ago after taking Vit-D2 50,000 units for 8 weeks. Patient has been taking a multivitamin with Vit D since stopping the Vit -D2 but he is not sure how much Vit D is in the multivitamin.  LLQ pain follow up/Adductor muscle strain No pain today. Patient states his pain has improved and resolved over the past two weeks. He is requesting a letter to return to work as he is not getting paid while missing work. Encouraged patient to lift only items that weigh 25lbs or less and to use good technique. Cont to do stretching exercises and take Iburprofen as needed. He is taking flexeril when he does not have to work and when the pain returns but has not needed it recently.   Denies fever, weight loss, nausea, vomiting, abdominal pain, blood in the stool, difficulty with defecation, diarrhea, or constipation.  Health Maintenance: none due today     ROS noted in HPI.   Past Medical, Surgical, Social, and Family History Reviewed & Updated per EMR.   Pertinent Historical Findings include:   Social History   Tobacco Use  Smoking Status Former Smoker  . Packs/day: 1.00  . Years: 42.00  . Pack years: 42.00  . Types: Cigarettes  . Start date: 12/11/1975  . Last attempt to quit: 12/30/2017  . Years since quitting: 0.2  Smokeless Tobacco Never Used   Objective: BP 102/64   Pulse 100   Temp 99 F (37.2 C) (Oral)   Wt 196 lb (88.9 kg)   SpO2 99%    BMI 30.70 kg/m  Vitals and nursing notes reviewed  Physical Exam  Constitutional: He is oriented to person, place, and time. He appears well-developed and well-nourished. No distress.  HENT:  Head: Normocephalic and atraumatic.  Eyes: Pupils are equal, round, and reactive to light. EOM are normal.  Cardiovascular: Normal rate, regular rhythm, normal heart sounds and intact distal pulses.  No murmur heard. Pulmonary/Chest: Effort normal and breath sounds normal. No respiratory distress. He has no wheezes. He has no rales.  Abdominal: Soft. Bowel sounds are normal. He exhibits no distension and no mass. There is no tenderness. There is no guarding.  Musculoskeletal: Normal range of motion. He exhibits no edema or deformity.  Neurological: He is alert and oriented to person, place, and time.  Skin: Skin is warm. Capillary refill takes less than 2 seconds. No erythema.   No results found for this or any previous visit (from the past 72 hour(s)).  Assessment/Plan:  Vitamin D deficiency Patient was slightly below normal levels at last check after finishing 50,000U of Vit-D2 for 8 weeks. Has been taking OTC multivitamin with Vit D.   Recheck today and if still below normal levels will contact patient and instruct him to take Vit D OTC at 800U per day and will recheck in several months.  TOBACCO USER Refilled Chantix and Nicoderm CQ patch.  Patient states he feels like they are helping him.  Encouraged patient to continue cutting back and trying to quit completely. Gave info on [employer].  Strain of adductor muscle of thigh Improved with ibuprofen and flexeril. Cont stretching.  Gave letter to return to work. Asked patient to limit his lifting to under 25lbs per item at a time for the next 2 weeks.   PATIENT EDUCATION PROVIDED: See AVS    Diagnosis and plan along with any newly prescribed medication(s) were discussed in detail with this patient today. The patient verbalized  understanding and agreed with the plan. Patient advised if symptoms worsen return to clinic or ER.   Health Maintainance:   Orders Placed This Encounter  Procedures  . Vitamin D, 25-hydroxy    Meds ordered this encounter  Medications  . CHANTIX 0.5 MG tablet    Sig: Take 1 tablet (0.5 mg total) by mouth 2 (two) times daily.    Dispense:  60 tablet    Refill:  2  . nicotine (NICODERM CQ - DOSED IN MG/24 HOURS) 14 mg/24hr patch    Sig: Place 1 patch (14 mg total) onto the skin daily.    Dispense:  30 patch    Refill:  Kendall West, DO 04/10/2018, 2:14 PM PGY-1, [place]

## 2018-04-11 ENCOUNTER — Telehealth: Payer: Self-pay | Admitting: Family Medicine

## 2018-04-11 ENCOUNTER — Other Ambulatory Visit: Payer: Self-pay | Admitting: Family Medicine

## 2018-04-11 ENCOUNTER — Encounter: Payer: Self-pay | Admitting: Family Medicine

## 2018-04-11 LAB — VITAMIN D 25 HYDROXY (VIT D DEFICIENCY, FRACTURES): Vit D, 25-Hydroxy: 18.7 ng/mL — ABNORMAL LOW (ref 30.0–100.0)

## 2018-04-11 MED ORDER — CHOLECALCIFEROL 10 MCG (400 UNIT) PO TABS: 800.0000 [IU] | ORAL_TABLET | Freq: Every day | ORAL | 0 refills | Status: DC

## 2018-04-11 NOTE — Progress Notes (Unsigned)
Called patient to instruct him to take Vit D OTC 800 units daily due to Vit D levels still below normal at 18.7. LVM.

## 2018-04-11 NOTE — Telephone Encounter (Signed)
Called patient and LVM informing him of Vit D level low and to start taking OTC Vit D3 supplement 800 units per day.

## 2018-08-15 ENCOUNTER — Encounter: Payer: Self-pay | Admitting: Family Medicine

## 2018-08-15 ENCOUNTER — Ambulatory Visit (INDEPENDENT_AMBULATORY_CARE_PROVIDER_SITE_OTHER): Payer: BLUE CROSS/BLUE SHIELD | Admitting: Family Medicine

## 2018-08-15 VITALS — BP 100/62 | HR 68 | Temp 98.2°F | Ht 67.0 in | Wt 196.0 lb

## 2018-08-15 DIAGNOSIS — M25511 Pain in right shoulder: Secondary | ICD-10-CM

## 2018-08-15 DIAGNOSIS — E559 Vitamin D deficiency, unspecified: Secondary | ICD-10-CM | POA: Diagnosis not present

## 2018-08-15 DIAGNOSIS — K121 Other forms of stomatitis: Secondary | ICD-10-CM | POA: Insufficient documentation

## 2018-08-15 DIAGNOSIS — Z0000 Encounter for general adult medical examination without abnormal findings: Secondary | ICD-10-CM

## 2018-08-15 DIAGNOSIS — M25512 Pain in left shoulder: Secondary | ICD-10-CM

## 2018-08-15 DIAGNOSIS — F17200 Nicotine dependence, unspecified, uncomplicated: Secondary | ICD-10-CM | POA: Diagnosis not present

## 2018-08-15 MED ORDER — BENZOCAINE 20 % MT GEL: 1.0000 "application " | Freq: Three times a day (TID) | OROMUCOSAL | 0 refills | Status: DC | PRN

## 2018-08-15 MED ORDER — METHYLPREDNISOLONE ACETATE 40 MG/ML IJ SUSP
40.0000 mg | Freq: Once | INTRAMUSCULAR | Status: AC
  Administered 2018-08-15: 40 mg via INTRAMUSCULAR

## 2018-08-15 NOTE — Assessment & Plan Note (Addendum)
Fasting Lipid panel today to get accurate assessment for ASCVD. Suspect he will be over 7.5% and need statin therapy even if LDL is under 100.

## 2018-08-15 NOTE — Assessment & Plan Note (Signed)
On physical exam area of the gum and inside of the mouth appears to be ulcerated. Likely an ucler. Benzocaine gel 20% until he can get to his dentist appointment on 9/13.

## 2018-08-15 NOTE — Patient Instructions (Addendum)
It was great to see you today! Thank you for letting me participate in your care!  Today, we discussed your right shoulder pain which is likely caused by a rotator cuff tendonitis. I injected your shoulder with a steroid. You should start feeling better today and I hope it will last you a long time (several months or more). I have also sent in a referral to PT for you. Please make sure you go and if you do not hear from them please let me know.  I have given you a prescription for an oral gel to help with your mouth pain until you can be seen by a dentist.  I will call you if your lab results are abnormal.  Be well, Harolyn Rutherford, DO PGY-2, [facility]

## 2018-08-15 NOTE — Assessment & Plan Note (Signed)
I suspect he has rotator cuff tendonitis and possible impingement. He has chronic shoulder pain made worse with overhead activity and heavy lifting. I do not suspect any acute RCT tear or full thickness tear.  Gave steroid injection subacromial today and will have patient do formal therapy as home exercises and OTC Tylenol and Advil have provided minimal relief of symptoms. If little to no improvement with PT, refer to Sports Medicine for further imaging and management.

## 2018-08-15 NOTE — Assessment & Plan Note (Signed)
Continue Chantix and nicotine patch

## 2018-08-15 NOTE — Addendum Note (Signed)
Addended by: Leonia Corona R on: 08/15/2018 05:21 PM   Modules accepted: Orders

## 2018-08-15 NOTE — Assessment & Plan Note (Signed)
Rechecking Vit D levels today to see if patient will benefit from going back on 50,000U weekly for 8 weeks or if he should continue 800U daily. Reinforced to patient importance of taking Vit D.

## 2018-08-15 NOTE — Progress Notes (Addendum)
Subjective: No chief complaint on file.   HPI: Preston Sullivan is a 52 y.o. presenting to clinic today to discuss the following:  Follow up for Shoulder Pain Patient presents today for follow up for his right shoulder pain. He has had this pain in the past and is made worse with heavy lifting. He presents today because the pain is worse. He states it is worse in his right shoulder but also has some pain in his left shoulder as well. Made worse with raising his arms above his head or to shoulder height. No numbness or tingling.  Low Vitamin D Patient still had low Vitamin D at last check on 5/2. Patient has normal kidney function on last labs. Rechecking labs today. He has been taking OTC Vit D 800U per day but has been "missing some days".  Tobacco Cessation Smoking less, down to 3 cigarettes a day and still using Chantix and nicotine patch.  ASCVD Lipid panel today, and start statin therapy due to ASCVD risk score =>7.5%  Mouth Pain Patient says last weekend he was at a barbeque and he thinks he bit into a bone that has since caused him a lot of pain in the back of his right mouth. No fever or chills.   Health Maintenance: none     ROS noted in HPI.   Past Medical, Surgical, Social, and Family History Reviewed & Updated per EMR.   Pertinent Historical Findings include:   Social History   Tobacco Use  Smoking Status Former Smoker  . Packs/day: 1.00  . Years: 42.00  . Pack years: 42.00  . Types: Cigarettes  . Start date: 12/11/1975  . Last attempt to quit: 12/30/2017  . Years since quitting: 0.6  Smokeless Tobacco Never Used    Objective: BP 100/62   Pulse 68   Temp 98.2 F (36.8 C)   Ht 5\' 7"  (1.702 m)   Wt 196 lb (88.9 kg)   SpO2 100%   BMI 30.70 kg/m  Vitals and nursing notes reviewed  Physical Exam Gen: Alert and Oriented x 3, NAD HEENT: Normocephalic, atraumatic, PERRLA, EOMI, non-swollen, area surrounding back right molar shows signs of ulceration  and healing, non-erythematous pharyngeal mucosa, no exudates MSK: Moves all four extremities, Right Shoulder: limited ROM in active motion due to pain, Hawkins negative, empty can test negative but has some weakness, negative drop arm test. Left Shoulder: limited ROM in active motion due to pain, Hawkins negative, empty can test negative, negative drop arm test. Ext: no clubbing, cyanosis, or edema Neuro: No gross deficits, UE strength 5/5, sensation grossly intact Skin: warm, dry, intact, no rashes  No results found for this or any previous visit (from the past 72 hour(s)).  Assessment/Plan:  TOBACCO USER Continue Chantix and nicotine patch  SHOULDER PAIN, BILATERAL I suspect he has rotator cuff tendonitis and possible impingement. He has chronic shoulder pain made worse with overhead activity and heavy lifting. I do not suspect any acute RCT tear or full thickness tear.  Gave steroid injection subacromial today and will have patient do formal therapy as home exercises and OTC Tylenol and Advil have provided minimal relief of symptoms. If little to no improvement with PT, refer to Sports Medicine for further imaging and management.  Healthcare maintenance Fasting Lipid panel today to get accurate assessment for ASCVD. Suspect he will be over 7.5% and need statin therapy even if LDL is under 100.  Mouth ulcer On physical exam area of the gum  and inside of the mouth appears to be ulcerated. Likely an ucler. Benzocaine gel 20% until he can get to his dentist appointment on 9/13.  Vitamin D deficiency Rechecking Vit D levels today to see if patient will benefit from going back on 50,000U weekly for 8 weeks or if he should continue 800U daily. Reinforced to patient importance of taking Vit D.   INJECTION: Patient was given informed consent, signed copy in the chart. Appropriate time out was taken. Area prepped and draped in usual sterile fashion.  1cc of methylprednisolone 40 mg/ml plus    4cc of 1% lidocaine without epinephrine was injected into the right shoulder using a(n) posterior approach. The patient tolerated the procedure well. There were no complications. Post procedure instructions were given.  PATIENT EDUCATION PROVIDED: See AVS    Diagnosis and plan along with any newly prescribed medication(s) were discussed in detail with this patient today. The patient verbalized understanding and agreed with the plan. Patient advised if symptoms worsen return to clinic or ER.   Health Maintainance:   Orders Placed This Encounter  Procedures  . Vitamin D, 25-hydroxy  . Lipid Panel  . Ambulatory referral to Physical Therapy    Referral Priority:   Routine    Referral Type:   Physical Medicine    Referral Reason:   Specialty Services Required    Requested Specialty:   Physical Therapy    Number of Visits Requested:   1    Meds ordered this encounter  Medications  . benzocaine (HURRICAINE) 20 % GEL    Sig: Use as directed 1 application in the mouth or throat 3 (three) times daily as needed (for mouth pain).    Dispense:  30 g    Refill:  0     Tim Garlan Fillers, DO 08/15/2018, 10:16 AM PGY-2 [place]

## 2018-08-16 LAB — LIPID PANEL
Chol/HDL Ratio: 4.6 ratio (ref 0.0–5.0)
Cholesterol, Total: 133 mg/dL (ref 100–199)
HDL: 29 mg/dL — ABNORMAL LOW (ref >39)
LDL Calculated: 79 mg/dL (ref 0–99)
Triglycerides: 126 mg/dL (ref 0–149)
VLDL Cholesterol Cal: 25 mg/dL (ref 5–40)

## 2018-08-16 LAB — VITAMIN D 25 HYDROXY (VIT D DEFICIENCY, FRACTURES): Vit D, 25-Hydroxy: 20.2 ng/mL — ABNORMAL LOW (ref 30.0–100.0)

## 2018-08-20 ENCOUNTER — Encounter: Payer: Self-pay | Admitting: Family Medicine

## 2018-08-20 MED ORDER — PRAVASTATIN SODIUM 40 MG PO TABS: 40.0000 mg | ORAL_TABLET | Freq: Every day | ORAL | 3 refills | Status: DC

## 2018-08-20 NOTE — Progress Notes (Signed)
Sending results to patient. I have called patient and discussed low Vit D with him. Encouraged he continue to take OTC 800U daily and I will recheck in 3-4 months. If it is still low I will repeat 50,000U weekly for 8 weeks and recheck.  Additionally, given his recent lipid panel his ASCVD 10 year risk score was calculated at 7.3%, just below the 7.5% risk where a moderate intensity statin would be recommended. I discussed with patient that option of going ahead and starting one even though technically he is below the 7.5% we will start pravastatin to reduce his 48yr risk.  Patient expressed understanding and agreement with the plan above via telephone.

## 2018-08-28 ENCOUNTER — Encounter: Payer: Self-pay | Admitting: Physical Therapy

## 2018-08-28 ENCOUNTER — Ambulatory Visit: Payer: BLUE CROSS/BLUE SHIELD | Attending: Family Medicine | Admitting: Physical Therapy

## 2018-08-28 ENCOUNTER — Other Ambulatory Visit: Payer: Self-pay

## 2018-08-28 DIAGNOSIS — G8929 Other chronic pain: Secondary | ICD-10-CM | POA: Diagnosis not present

## 2018-08-28 DIAGNOSIS — M25512 Pain in left shoulder: Secondary | ICD-10-CM | POA: Insufficient documentation

## 2018-08-28 DIAGNOSIS — M25511 Pain in right shoulder: Secondary | ICD-10-CM | POA: Diagnosis not present

## 2018-08-28 NOTE — Therapy (Signed)
[place], [place], [address] Phone: [phone]   Fax:  [phone]  Physical Therapy Evaluation  Patient Details  Name: Preston Sullivan MRN: [id number] Date of Birth: 1966/11/10 Referring Provider: Lady Deutscher MD   Encounter Date: 08/28/2018  PT End of Session - 08/28/18 0856    Visit Number  1    Number of Visits  13    Date for PT Re-Evaluation  10/09/18    Authorization Type  BCBS    PT Start Time  0848    PT Stop Time  0928    PT Time Calculation (min)  40 min    Activity Tolerance  Patient tolerated treatment well    Behavior During Therapy  [facility] for tasks assessed/performed       Past Medical History:  Diagnosis Date  . Arthritis   . GERD (gastroesophageal reflux disease)     Past Surgical History:  Procedure Laterality Date  . APPENDECTOMY      There were no vitals filed for this visit.   Subjective Assessment - 08/28/18 0855    Subjective  pt is a 52 y.o M with CC of bil shoulder pain that started about a year ago with no specific onset he thinks it could be due to overuse. The R shoulder is worse than the L. Reports when he was younger he dislocated his R shoulder. he reported he got an injection in the R shoulder in the last 2 weeks with significant improvement. Pain stayed mostly in the shoulder and denies referral down the arm.     How long can you sit comfortably?  unlimited    How long can you stand comfortably?  8 hours    How long can you walk comfortably?  8 hours    Diagnostic tests  N/A    Patient Stated Goals  strength the shoulders, and hopefully add the back to the POC when he sees his providre    Currently in Pain?  Yes    Pain Score  0-No pain   at worst 10/10   Pain Location  Shoulder    Pain Orientation  Left;Right   R>L   Pain Descriptors / Indicators  Aching;Sharp;Sore;Dull    Pain Type  Chronic pain    Pain Frequency  Constant    Aggravating Factors    nothing at the moment due to the shot. but previously lifting pulling/ carrying, pushing pulling    Pain Relieving Factors  feeling good now, but prevously take medication, heat         [facility] PT Assessment - 08/28/18 0852      Assessment   Medical Diagnosis  B shoulder pain    Referring Provider  Lady Deutscher MD    Onset Date/Surgical Date  --   around 1 year   Hand Dominance  Right    Next MD Visit  09/26/2018    Prior Therapy  no      Precautions   Precautions  None      Restrictions   Weight Bearing Restrictions  No      Balance Screen   Has the patient fallen in the past 6 months  No    Has the patient had a decrease in activity level because of a fear of falling?   No    Is the patient reluctant to leave their home because of a fear of falling?   No      Home Environment  Living Environment  Private residence    Living Arrangements  Spouse/significant other    Available Help at Discharge  Family;Available PRN/intermittently    Type of Home  Apartment    Home Access  Stairs to enter    Entrance Stairs-Number of Steps  13    Entrance Stairs-Rails  Can reach both    Home Layout  One level      Prior Function   Level of Independence  Independent    Vocation  Full time employment   [employer]   Vocation Requirements  walking/ standing, bending, lifting/ pushing    Leisure  fishing,       Cognition   Overall Cognitive Status  Within Functional Limits for tasks assessed      Observation/Other Assessments   Focus on Therapeutic Outcomes (FOTO)   8% limited   predicted 18% limited     Posture/Postural Control   Posture/Postural Control  Postural limitations    Postural Limitations  Rounded Shoulders;Forward head      ROM / Strength   AROM / PROM / Strength  AROM;PROM;Strength      AROM   Overall AROM   Within functional limits for tasks performed    Overall AROM Comments  L shoulder alittle sore with reaching behind the back    AROM Assessment Site   Shoulder    Right/Left Shoulder  Right;Left      PROM   PROM Assessment Site  Shoulder    Right/Left Shoulder  Right;Left      Strength   Strength Assessment Site  Shoulder;Hand    Right/Left Shoulder  Right;Left    Right Shoulder Flexion  4/5    Right Shoulder Extension  5/5    Right Shoulder ABduction  4-/5    Right Shoulder Internal Rotation  5/5    Right Shoulder External Rotation  4-/5    Left Shoulder Flexion  4+/5    Left Shoulder Extension  5/5    Left Shoulder ABduction  4/5   pain during testing   Left Shoulder Internal Rotation  5/5    Left Shoulder External Rotation  4-/5   pain during testing   Right Hand Grip (lbs)  73.3   91,47,82   Left Hand Grip (lbs)  80.6   90,82,70     Palpation   Palpation comment  TTP in bil upper traps with mulitple trigger points. tightness notedin the pec major/ minor on the L      Special Tests    Special Tests  Rotator Cuff Impingement    Rotator Cuff Impingment tests  Michel Bickers test;Empty Can test;Full Can test      Hawkins-Kennedy test   Findings  Negative      Empty Can test   Findings  Negative      Full Can test   Findings  Negative                Objective measurements completed on examination: See above findings.      [facility] Adult PT Treatment/Exercise - 08/28/18 0852      Exercises   Exercises  Shoulder      Shoulder Exercises: Standing   Other Standing Exercises  scapular retraction with ER 3 x 10 with red theraband,. visual cues to keep shoulder down to promote proper form      Shoulder Exercises: Stretch   Corner Stretch  3 reps;30 seconds   staggering feet to keep trunk in neutral position   Other  Shoulder Stretches  upper trap stretch bil 2 x 30 sec             PT Education - 08/28/18 0856    Education Details  evaluation findings, POC, goals, HEp with proper form/ rationale.     Person(s) Educated  Patient    Methods  Explanation;Verbal cues;Handout;Demonstration     Comprehension  Verbalized understanding;Verbal cues required;Returned demonstration       PT Short Term Goals - 08/28/18 0858      PT SHORT TERM GOAL #1   Title  pt to be I with inital HEP     Time  3    Period  Weeks    Target Date  09/18/18      PT SHORT TERM GOAL #2   Title  pt to verbalize proper posture and lifting techniques to prevent and reduce shoulder pain     Time  3    Period  Weeks    Status  New    Target Date  09/18/18      PT SHORT TERM GOAL #3   Title  increase R grip strength by >/= 8# to demo improvement in R shoulder function    Time  3    Period  Weeks    Status  New    Target Date  09/18/18        PT Long Term Goals - 08/28/18 0933      PT LONG TERM GOAL #1   Title  pt increase bil shoulder ER and abduction to >/= 4+/5 in with no pain to assist with work related activities     Time  6    Period  Weeks    Status  New    Target Date  10/09/18      PT LONG TERM GOAL #2   Title  pt to be able to perform all activities at work and ADLs with no report of pain or muscle tightness    Time  6    Period  Weeks    Status  New    Target Date  10/09/18      PT LONG TERM GOAL #3   Title  pt to be I with all HEP given as of last visit to maintain and progress current level of function    Time  6    Period  Weeks    Status  New    Target Date  10/09/18      PT LONG TERM GOAL #4   Title  increase FOTO score by >/= 3-5 points to demo improvement in function     Time  6    Period  Weeks    Status  New    Target Date  10/09/18             Plan - 08/28/18 0856    Clinical Impression Statement  pt presents to OPPT with CC of bil shoulder pain with R>L that started roughly around a year ago with no specific onset. He has functional ROM bil with mild soreness in the L with Internal rotation reaching behind back. mild weakness noted in bil shoulder abductors and external rotations with soreness noted only in the L during testing. He was able to perform  all exercises today reporting he felt better. he would benefit from physical therapy to decrease bil shoulder pain, promote proper posture/ lifting mechanics and return to PLOF by addressing the deficits listed.     Clinical Presentation  Stable    Clinical Decision Making  Low    Rehab Potential  Good    PT Frequency  2x / week    PT Duration  6 weeks    PT Treatment/Interventions  ADLs/Self Care Home Management;Cryotherapy;Electrical Stimulation;Iontophoresis 4mg /ml Dexamethasone;Moist Heat;Ultrasound;Passive range of motion;Dry needling;Manual techniques;Therapeutic exercise;Therapeutic activities;Patient/family education;Neuromuscular re-education;Taping    PT Next Visit Plan  review/ update HEP, posture and lifting mechanics, scapular stability/ rotator cuff strengtheing, STW PRN for upper traps/ pec,     PT Home Exercise Plan  upper trap stretching, scapular retraction with ER, pec stretch    Consulted and Agree with Plan of Care  Patient       Patient will benefit from skilled therapeutic intervention in order to improve the following deficits and impairments:  Pain, Increased fascial restricitons, Decreased strength, Improper body mechanics, Postural dysfunction, Decreased endurance, Decreased activity tolerance, Increased muscle spasms  Visit Diagnosis: Chronic left shoulder pain  Chronic right shoulder pain     Problem List Patient Active Problem List   Diagnosis Date Noted  . Mouth ulcer 08/15/2018  . Marijuana use, continuous 04/07/2018  . Left lower quadrant abdominal tenderness with rebound tenderness 04/07/2018  . Strain of adductor muscle of thigh 04/07/2018  . Healthcare maintenance 01/22/2018  . Epididymitis 01/01/2018  . Other emphysema ([place]) 12/23/2017  . Unstable angina ([place]) 11/10/2016  . SHOULDER PAIN, BILATERAL 08/09/2010  . Vitamin D deficiency 12/13/2009  . GLUCOSE INTOLERANCE 12/13/2009  . DENTAL CARIES 12/13/2009  . Backache 12/13/2009  . LIVER PAIN  11/29/2009  . TOBACCO USER 10/31/2009  . DEPRESSION 10/31/2009  . ALLERGIC RHINITIS 10/31/2009  . GERD 10/31/2009   Starr Lake PT, DPT, LAT, ATC  08/28/18  9:59 AM      [facility] [address] [place], [place], [address] Phone: [phone]   Fax:  [phone]  Name: Preston Sullivan MRN: [id number] Date of Birth: 04-Oct-1966

## 2018-09-05 ENCOUNTER — Encounter

## 2018-09-08 ENCOUNTER — Ambulatory Visit: Payer: BLUE CROSS/BLUE SHIELD | Admitting: Physical Therapy

## 2018-09-08 ENCOUNTER — Telehealth: Payer: Self-pay | Admitting: Physical Therapy

## 2018-09-08 NOTE — Telephone Encounter (Signed)
Called patient about missed visit.  He had tried to call earlier this morning to cancel.  His wife  had all her teeth pulled out with oral surgery this morning and he needed to be with her.  He plans to attend the next appointment on 09/12/2018 with Vania Rea at 9:30. Melvenia Needles PTA

## 2018-09-12 ENCOUNTER — Ambulatory Visit: Payer: BLUE CROSS/BLUE SHIELD | Attending: Family Medicine | Admitting: Physical Therapy

## 2018-09-12 ENCOUNTER — Encounter: Payer: Self-pay | Admitting: Physical Therapy

## 2018-09-12 DIAGNOSIS — G8929 Other chronic pain: Secondary | ICD-10-CM | POA: Insufficient documentation

## 2018-09-12 DIAGNOSIS — M25511 Pain in right shoulder: Secondary | ICD-10-CM | POA: Diagnosis not present

## 2018-09-12 DIAGNOSIS — M25512 Pain in left shoulder: Secondary | ICD-10-CM | POA: Insufficient documentation

## 2018-09-12 NOTE — Therapy (Addendum)
[place], [place], [address] Phone: [phone]   Fax:  [phone]  Physical Therapy Treatment / discharge summary  Patient Details  Name: Preston Sullivan MRN: [id number] Date of Birth: December 29, 1965 Referring Provider (PT): Preston Deutscher MD   Encounter Date: 09/12/2018  PT End of Session - 09/12/18 0938    Visit Number  2    Number of Visits  13    Date for PT Re-Evaluation  10/09/18    Authorization Type  BCBS    PT Start Time  0934    PT Stop Time  1014    PT Time Calculation (min)  40 min    Activity Tolerance  Patient tolerated treatment well    Behavior During Therapy  [facility] for tasks assessed/performed       Past Medical History:  Diagnosis Date  . Arthritis   . GERD (gastroesophageal reflux disease)     Past Surgical History:  Procedure Laterality Date  . APPENDECTOMY      There were no vitals filed for this visit.  Subjective Assessment - 09/12/18 0939    Subjective  " I am doing my exercises every day and only alittle soreness every once in a while but not bad"     Currently in Pain?  No/denies    Pain Type  Chronic pain    Pain Frequency  Intermittent    Aggravating Factors   unsure of what causes it                       [facility] Adult PT Treatment/Exercise - 09/12/18 0940      Shoulder Exercises: Standing   External Rotation  Strengthening;Both;15 reps;Theraband   x 2 sets   Theraband Level (Shoulder External Rotation)  Level 3 (Green)    Internal Rotation  Strengthening;Both;15 reps;Theraband   x 2 sets   Theraband Level (Shoulder Internal Rotation)  Level 3 (Green)    Flexion  Strengthening;15 reps;Weights   scaption position   Shoulder Flexion Weight (lbs)  2    Extension  Strengthening;15 reps;Theraband    Theraband Level (Shoulder Extension)  Level 3 (Green)    Row  Strengthening;15 reps;Theraband    Theraband Level (Shoulder Row)  Level 3 (Green)       Shoulder Exercises: ROM/Strengthening   UBE (Upper Arm Bike)  L1 x 6 min    changing direction at 3 min      Shoulder Exercises: Stretch   Corner Stretch  2 reps;30 seconds    Other Shoulder Stretches  upper trap stretch bil 2 x 30 sec             PT Education - 09/12/18 1013    Education Details  reviewed previously provided HEP, and updated for shoulder strengthening    Person(s) Educated  Patient    Methods  Explanation;Verbal cues;Demonstration    Comprehension  Verbalized understanding;Verbal cues required;Returned demonstration       PT Short Term Goals - 08/28/18 0858      PT SHORT TERM GOAL #1   Title  pt to be I with inital HEP     Time  3    Period  Weeks    Target Date  09/18/18      PT SHORT TERM GOAL #2   Title  pt to verbalize proper posture and lifting techniques to prevent and reduce shoulder pain     Time  3    Period  Weeks    Status  New    Target Date  09/18/18      PT SHORT TERM GOAL #3   Title  increase R grip strength by >/= 8# to demo improvement in R shoulder function    Time  3    Period  Weeks    Status  New    Target Date  09/18/18        PT Long Term Goals - 08/28/18 0933      PT LONG TERM GOAL #1   Title  pt increase bil shoulder ER and abduction to >/= 4+/5 in with no pain to assist with work related activities     Time  6    Period  Weeks    Status  New    Target Date  10/09/18      PT LONG TERM GOAL #2   Title  pt to be able to perform all activities at work and ADLs with no report of pain or muscle tightness    Time  6    Period  Weeks    Status  New    Target Date  10/09/18      PT LONG TERM GOAL #3   Title  pt to be I with all HEP given as of last visit to maintain and progress current level of function    Time  6    Period  Weeks    Status  New    Target Date  10/09/18      PT LONG TERM GOAL #4   Title  increase FOTO score by >/= 3-5 points to demo improvement in function     Time  6    Period  Weeks     Status  New    Target Date  10/09/18            Plan - 09/12/18 1014    Clinical Impression Statement  Mr. Preston Sullivan reports consistency with his HEP and notes only intermittent soreness but otherwise is doing well. He did require intermittent cues on proper stretching form for doorway stretch. focused on strengthening of the shoulder which he did well reporting no pain. updated Hep for rotator cuff strengthening.     PT Treatment/Interventions  ADLs/Self Care Home Management;Cryotherapy;Electrical Stimulation;Iontophoresis 76m/ml Dexamethasone;Moist Heat;Ultrasound;Passive range of motion;Dry needling;Manual techniques;Therapeutic exercise;Therapeutic activities;Patient/family education;Neuromuscular re-education;Taping    PT Next Visit Plan  review/ update HEP, posture and lifting mechanics, scapular stability/ rotator cuff strengtheing, STW PRN for upper traps/ pec,     PT Home Exercise Plan  upper trap stretching, scapular retraction with ER, pec stretch, external rotation, internal rotation, rows, shoulder extension,     Consulted and Agree with Plan of Care  Patient       Patient will benefit from skilled therapeutic intervention in order to improve the following deficits and impairments:  Pain, Increased fascial restricitons, Decreased strength, Improper body mechanics, Postural dysfunction, Decreased endurance, Decreased activity tolerance, Increased muscle spasms  Visit Diagnosis: Chronic left shoulder pain  Chronic right shoulder pain     Problem List Patient Active Problem List   Diagnosis Date Noted  . Mouth ulcer 08/15/2018  . Marijuana use, continuous 04/07/2018  . Left lower quadrant abdominal tenderness with rebound tenderness 04/07/2018  . Strain of adductor muscle of thigh 04/07/2018  . Healthcare maintenance 01/22/2018  . Epididymitis 01/01/2018  . Other emphysema ([place] 12/23/2017  . Unstable angina ([place] 11/10/2016  . SHOULDER PAIN, BILATERAL 08/09/2010  .  Vitamin D deficiency 12/13/2009  . GLUCOSE INTOLERANCE 12/13/2009  . DENTAL CARIES 12/13/2009  . Backache 12/13/2009  . LIVER PAIN 11/29/2009  . TOBACCO USER 10/31/2009  . DEPRESSION 10/31/2009  . ALLERGIC RHINITIS 10/31/2009  . GERD 10/31/2009   Preston Sullivan PT, DPT, LAT, ATC  09/12/18  10:17 AM      [facility] [address] [place], [place], [address] Phone: [phone]   Fax:  [phone]  Name: Preston Sullivan MRN: [id number] Date of Birth: 02/13/1966        PHYSICAL THERAPY DISCHARGE SUMMARY  Visits from Start of Care: 2  Current functional level related to goals / functional outcomes: See goals   Remaining deficits: unknown   Education / Equipment: HEP, theraband, posture  Plan: Patient agrees to discharge.  Patient goals were not met. Patient is being discharged due to not returning since the last visit.  ?????         Shagun Wordell PT, DPT, LAT, ATC  10/23/18  2:07 PM

## 2018-09-17 ENCOUNTER — Ambulatory Visit: Payer: BLUE CROSS/BLUE SHIELD | Admitting: Physical Therapy

## 2018-09-17 ENCOUNTER — Telehealth: Payer: Self-pay | Admitting: Physical Therapy

## 2018-09-17 NOTE — Telephone Encounter (Signed)
Called patient about 2nd missed visit.  He overslept from working late.  He said he was sorry. Date and time of next visit given. I asked him to refer to the policy for missed visits.  Melvenia Needles  PTA

## 2018-09-19 ENCOUNTER — Telehealth: Payer: Self-pay | Admitting: Physical Therapy

## 2018-09-19 ENCOUNTER — Ambulatory Visit: Payer: BLUE CROSS/BLUE SHIELD | Admitting: Physical Therapy

## 2018-09-19 NOTE — Telephone Encounter (Signed)
LVM regarding missed appointment today and that it being his second consecutive missed appointment and per clinic policy we are required to cancel future appointments and drop down to 1 appointment at a time. If he is to miss the next visit it would be 3 missed appointments which is grounds for discharge.reminded of the next appointment time and date and If he is unable to attend that  appointment to call and we can cancel and work to reschedule that appointment.

## 2018-09-24 ENCOUNTER — Ambulatory Visit: Payer: BLUE CROSS/BLUE SHIELD | Admitting: Physical Therapy

## 2018-09-26 ENCOUNTER — Ambulatory Visit: Payer: BLUE CROSS/BLUE SHIELD | Admitting: Physical Therapy

## 2018-09-26 ENCOUNTER — Ambulatory Visit: Payer: Self-pay | Admitting: Family Medicine

## 2018-10-01 ENCOUNTER — Ambulatory Visit: Payer: BLUE CROSS/BLUE SHIELD | Admitting: Physical Therapy

## 2018-10-03 ENCOUNTER — Ambulatory Visit: Payer: BLUE CROSS/BLUE SHIELD | Admitting: Physical Therapy

## 2018-10-21 ENCOUNTER — Ambulatory Visit (INDEPENDENT_AMBULATORY_CARE_PROVIDER_SITE_OTHER): Payer: BLUE CROSS/BLUE SHIELD | Admitting: Family Medicine

## 2018-10-21 VITALS — BP 130/80 | HR 79 | Temp 98.2°F | Wt 202.0 lb

## 2018-10-21 DIAGNOSIS — G8929 Other chronic pain: Secondary | ICD-10-CM

## 2018-10-21 DIAGNOSIS — Z23 Encounter for immunization: Secondary | ICD-10-CM

## 2018-10-21 DIAGNOSIS — M545 Low back pain, unspecified: Secondary | ICD-10-CM

## 2018-10-21 MED ORDER — METHOCARBAMOL 500 MG PO TABS: 500.0000 mg | ORAL_TABLET | Freq: Three times a day (TID) | ORAL | 1 refills | Status: DC

## 2018-10-21 MED ORDER — OMEPRAZOLE 20 MG PO CPDR: 20.0000 mg | DELAYED_RELEASE_CAPSULE | Freq: Two times a day (BID) | ORAL | 2 refills | Status: DC

## 2018-10-21 NOTE — Progress Notes (Signed)
Subjective: Chief Complaint  Patient presents with  . Back Pain  . Gastroesophageal Reflux  . Flu Vaccine    HPI: Preston Sullivan is a 52 y.o. presenting to clinic today to discuss the following:  GERD Patient states for the past few weeks he has had worse heartburn. He states he usually takes OTC Zantac but recently he has been unable to find it in the store. He was told it was pulled from the shelves due to it possibly "causing heart problems". He has been taking another medication OTC but can't remember what it is but states it has not been helping his symptoms. He is eating late and laying down after eating. I did explain to the patient to avoid eating late at night (difficult for this patient as he works 2nd shift) and to not lay down or go to sleep for at least 1 hour after a meal.  Back Pain Patient has attended PT for his back and has been doing some home exercises which help however his back pain has started to give him problems once again. He states his pain is worse after working where he has to bend over and/or stoop frequently. It is also "stiff" in the morning after sleeping. He has pain on and off and is having an acute exacerbation of back pain today. He has had this pain for years. No bowel or bladder incontinence. No numbness or tingling or loss of balance. He has done some home exercises and stretches that do help. He has tried multiple medications in the past such as tylenol, NSAIDs, flexeril, OTC icy-hot that provide little relief.  Health Maintenance: influenza vaccine given today     ROS noted in HPI.   Past Medical, Surgical, Social, and Family History Reviewed & Updated per EMR.   Pertinent Historical Findings include:   Social History   Tobacco Use  Smoking Status Former Smoker  . Packs/day: 1.00  . Years: 42.00  . Pack years: 42.00  . Types: Cigarettes  . Start date: 12/11/1975  . Last attempt to quit: 12/30/2017  . Years since quitting: 0.8    Smokeless Tobacco Never Used   Objective: BP 130/80   Pulse 79   Temp 98.2 F (36.8 C)   Wt 202 lb (91.6 kg)   SpO2 100%   BMI 31.64 kg/m  Vitals and nursing notes reviewed  Physical Exam Gen: Alert and Oriented x 3, NAD HEENT: Normocephalic, atraumatic CV: RRR, no murmurs, normal S1, S2 split Resp: CTAB, no wheezing, rales, or rhonchi, comfortable work of breathing MSK: No obvious gross deformity of the spine, limited range of motion in spinal flexion and extension with pain in both movements in the lower thoracic region, full ROM in rotation and side bending, TTP along the thoracic and lumbar paraspinal musculature bilaterally, positive straight leg raise test on the right, negative on the left. Ext: no clubbing, cyanosis, or edema Neuro: No gross deficits, gross sensation intact in the lower extremities bilaterally, 5/5 strength testing in hip flexion, adduction, abduction, knee extension, flexion, ankle dorsi and plantar flexion. Skin: warm, dry, intact, no rashes  No results found for this or any previous visit (from the past 72 hour(s)).  Assessment/Plan:  Backache Patient had lumbar AP and Lateral x-rays that was negative for any deformity and made no comment of signs of arthritis. He has had recurring back pain that gets worse with activity and better with rest but has struggled with acute exacerbations recently. He has  completed PT sessions and has home exercises that have helped some but given the recurrence of this issue I do wonder if he has some arthritic changes causing his symptoms - Referral to Orthopedics to further discuss options for suspected thoracic-lumbar osteoarthritis. May benefit from injection but obviously that decision needs to be made by the orthopedist. - Since I am referring to Ortho I will hold off on getting thoracic spine x-rays as he will most likely get some at [facility] when he has his appointment. - Robaxin TID for pain - Continue home  exercises and stretches daily that he was shown in PT - Ice/heat as needed on the back; advised patient to try OTC Capzasin cream.  PATIENT EDUCATION PROVIDED: See AVS    Diagnosis and plan along with any newly prescribed medication(s) were discussed in detail with this patient today. The patient verbalized understanding and agreed with the plan. Patient advised if symptoms worsen return to clinic or ER.   Health Maintainance: influenza vaccine   Orders Placed This Encounter  Procedures  . Flu Vaccine QUAD 36+ mos IM  . Ambulatory referral to Orthopedic Surgery    Referral Priority:   Routine    Referral Type:   Surgical    Referral Reason:   Specialty Services Required    Requested Specialty:   Orthopedic Surgery    Number of Visits Requested:   1    Meds ordered this encounter  Medications  . omeprazole (PRILOSEC) 20 MG capsule    Sig: Take 1 capsule (20 mg total) by mouth 2 (two) times daily before a meal.    Dispense:  60 capsule    Refill:  2  . methocarbamol (ROBAXIN) 500 MG tablet    Sig: Take 1 tablet (500 mg total) by mouth 3 (three) times daily.    Dispense:  90 tablet    Refill:  [place], DO 10/21/2018, 10:53 AM PGY-2 [place]

## 2018-10-21 NOTE — Patient Instructions (Signed)
It was great to see you today! Thank you for letting me participate in your care!  Today, we discussed your GERD symptoms and I have stopped your ranitidine and started you on omeprazole. Please take it twice daily for one month. After one month please only take it once per day.  For your back pain it will be important to continue doing the home exercises that you learned at PT. Also, I have written you a prescription for Robaxin which is a muscle relaxer that I believe will help. You can also try an OTC cream called Capzasin. I have included a picture below. I have also referred you to an orthopedic clinic as they may be able to offer you further options in treatment for your back pain.    Be well, Harolyn Rutherford, DO PGY-2, [facility]

## 2018-10-22 DIAGNOSIS — Z23 Encounter for immunization: Secondary | ICD-10-CM | POA: Insufficient documentation

## 2018-10-22 NOTE — Assessment & Plan Note (Addendum)
Patient had lumbar AP and Lateral x-rays that was negative for any deformity and made no comment of signs of arthritis. He has had recurring back pain that gets worse with activity and better with rest but has struggled with acute exacerbations recently. He has completed PT sessions and has home exercises that have helped some but given the recurrence of this issue I do wonder if he has some arthritic changes causing his symptoms - Referral to Orthopedics to further discuss options for suspected thoracic-lumbar osteoarthritis. May benefit from injection but obviously that decision needs to be made by the orthopedist. - Since I am referring to Ortho I will hold off on getting thoracic spine x-rays as he will most likely get some at [facility] when he has his appointment. - Robaxin TID for pain - Continue home exercises and stretches daily that he was shown in PT - Ice/heat as needed on the back; advised patient to try OTC Capzasin cream.

## 2018-12-30 ENCOUNTER — Ambulatory Visit (INDEPENDENT_AMBULATORY_CARE_PROVIDER_SITE_OTHER): Payer: BLUE CROSS/BLUE SHIELD | Admitting: Family Medicine

## 2018-12-30 ENCOUNTER — Other Ambulatory Visit: Payer: Self-pay

## 2018-12-30 ENCOUNTER — Encounter: Payer: Self-pay | Admitting: Family Medicine

## 2018-12-30 VITALS — BP 106/60 | HR 71 | Temp 98.3°F | Ht 67.0 in | Wt 204.8 lb

## 2018-12-30 DIAGNOSIS — R0609 Other forms of dyspnea: Secondary | ICD-10-CM | POA: Diagnosis not present

## 2018-12-30 DIAGNOSIS — M545 Low back pain, unspecified: Secondary | ICD-10-CM

## 2018-12-30 DIAGNOSIS — E559 Vitamin D deficiency, unspecified: Secondary | ICD-10-CM

## 2018-12-30 DIAGNOSIS — G8929 Other chronic pain: Secondary | ICD-10-CM | POA: Diagnosis not present

## 2018-12-30 DIAGNOSIS — R0600 Dyspnea, unspecified: Secondary | ICD-10-CM

## 2018-12-30 MED ORDER — OMEPRAZOLE 20 MG PO CPDR: 20.0000 mg | DELAYED_RELEASE_CAPSULE | Freq: Two times a day (BID) | ORAL | 2 refills | Status: DC

## 2018-12-30 MED ORDER — PRAVASTATIN SODIUM 40 MG PO TABS: 40.0000 mg | ORAL_TABLET | Freq: Every day | ORAL | 3 refills | Status: AC

## 2018-12-30 NOTE — Progress Notes (Signed)
Subjective: Chief Complaint  Patient presents with  . Back Pain     HPI: Preston Sullivan is a 53 y.o. presenting to clinic today to discuss the following:  "Short of Breath" Patient states in the last 2 weeks he has been getting short of breath. At rest he is fine but if he gets up to go to the bathroom he is winded and he starts to breath hard and his breathing rate increases. He sits down for a few minutes and his breathing returns to normal. He is using 2-3 pillows to sleep at night which is a recent increase and he was observed waking up at night gasping for air by his wife. He has not been sick and denies any recent illness. He has never had this happen before. These episodes are NOT accompanied by any chest pain or pressure. He has no palpitations during these episodes.   He denies fever, chills, chest pain, pressure, abdominal pain, nausea, vomiting, diarrhea, or constipation.  He does have associated cough which is non-productive  Of note, he is a current smoker and last had PFTs about 1 year ago which showed no significant COPD.  Low Back Pain Patient continues to have low back pain and robaxin is not working so he stopped it.  He was referred to orthopedics because he has done formal PT, stretches and exercises at home, and several OTC methods with little improvement. His insurance would not cover the provider I sent him to so I will refer him again and simply let insurance dictate where he can be seen due to his financial constraints.  Vitamin D History of low Vitamin D, taking OTC 400U per day. Recheck levels today.  Health Maintenance: none     ROS noted in HPI.   Past Medical, Surgical, Social, and Family History Reviewed & Updated per EMR.   Pertinent Historical Findings include:   Social History   Tobacco Use  Smoking Status Former Smoker  . Packs/day: 1.00  . Years: 42.00  . Pack years: 42.00  . Types: Cigarettes  . Start date: 12/11/1975  . Last attempt  to quit: 12/30/2017  . Years since quitting: 1.0  Smokeless Tobacco Never Used    Objective: BP 106/60   Pulse 71   Temp 98.3 F (36.8 C) (Oral)   Ht 5\' 7"  (1.702 m)   Wt 204 lb 12.8 oz (92.9 kg)   SpO2 99%   BMI 32.08 kg/m  Vitals and nursing notes reviewed  Physical Exam Gen: Alert and Oriented x 3, NAD HEENT: Normocephalic, atraumatic, PERRLA, EOMI,  non-swollen, non-erythematous turbinates, non-erythematous pharyngeal mucosa, no exudates Neck: trachea midline, no thyroidmegaly, no LAD CV: RRR, no murmurs, normal S1, S2 split Resp: CTAB, no wheezing, rales, or rhonchi, comfortable work of breathing Abd: non-distended, non-tender, soft, +bs in all four quadrants Ext: no clubbing, cyanosis, or edema Skin: warm, dry, intact, no rashes   Results for orders placed or performed in visit on 12/30/18 (from the past 72 hour(s))  VITAMIN D 25 Hydroxy (Vit-D Deficiency, Fractures)     Status: Abnormal   Collection Time: 12/30/18  9:43 AM  Result Value Ref Range   Vit D, 25-Hydroxy 9.1 (L) 30.0 - 100.0 ng/mL    Comment: Vitamin D deficiency has been defined by the Institute of Medicine and an Endocrine Society practice guideline as a level of serum 25-OH vitamin D less than 20 ng/mL (1,2). The Endocrine Society went on to further define vitamin D insufficiency  as a level between 21 and 29 ng/mL (2). 1. IOM (Institute of Medicine). 2010. Dietary reference    intakes for calcium and D. Guy: The    [employer]. 2. Holick MF, Binkley [place], Bischoff-Ferrari HA, et al.    Evaluation, treatment, and prevention of vitamin D    deficiency: an Endocrine Society clinical practice    guideline. JCEM. 2011 Jul; 96(7):1911-30.   TSH + free T4     Status: None   Collection Time: 12/30/18  9:43 AM  Result Value Ref Range   TSH 2.440 0.450 - 4.500 uIU/mL   Free T4 0.92 0.82 - 1.77 ng/dL  CBC with Differential     Status: None   Collection Time: 12/30/18  9:43 AM  Result  Value Ref Range   WBC 4.9 3.4 - 10.8 x10E3/uL   RBC 4.92 4.14 - 5.80 x10E6/uL   Hemoglobin 14.6 13.0 - 17.7 g/dL   Hematocrit 44.2 37.5 - 51.0 %   MCV 90 79 - 97 fL   MCH 29.7 26.6 - 33.0 pg   MCHC 33.0 31.5 - 35.7 g/dL   RDW 13.2 11.6 - 15.4 %   Platelets 233 150 - 450 x10E3/uL   Neutrophils 38 Not Estab. %   Lymphs 48 Not Estab. %   Monocytes 10 Not Estab. %   Eos 2 Not Estab. %   Basos 1 Not Estab. %   Neutrophils Absolute 1.8 1.4 - 7.0 x10E3/uL   Lymphocytes Absolute 2.4 0.7 - 3.1 x10E3/uL   Monocytes Absolute 0.5 0.1 - 0.9 x10E3/uL   EOS (ABSOLUTE) 0.1 0.0 - 0.4 x10E3/uL   Basophils Absolute 0.1 0.0 - 0.2 x10E3/uL   Immature Granulocytes 1 Not Estab. %   Immature Grans (Abs) 0.1 0.0 - 0.1 R51O8/CZ  Basic Metabolic Panel     Status: Abnormal   Collection Time: 12/30/18  9:43 AM  Result Value Ref Range   Glucose 87 65 - 99 mg/dL   BUN 10 6 - 24 mg/dL   Creatinine, Ser 0.72 (L) 0.76 - 1.27 mg/dL   GFR calc non Af Amer 105 >59 mL/min/1.73   GFR calc Af Amer 121 >59 mL/min/1.73   BUN/Creatinine Ratio 14 9 - 20   Sodium 141 134 - 144 mmol/L   Potassium 4.4 3.5 - 5.2 mmol/L   Chloride 103 96 - 106 mmol/L   CO2 24 20 - 29 mmol/L   Calcium 9.3 8.7 - 10.2 mg/dL    Assessment/Plan:  Vitamin D deficiency Low again. Will call patient and notify and restart 50,000U weekly for another 8 weeks  Backache Referral to Ortho as conservative measures have not given much relief. He could not go before b/c the specific provider I referred him to was out of network and so too costly. - Made another referral to an in-network provider  Dyspnea on exertion Differential diagnosis includes new onset CHF vs COPD as the two most likely candidates. I favor COPD given that he has a long history of smoking, no chest pain, no swelling or edema in his LE, and overall his fluid status appears normal. - ECHO to rule out CHF - PFTs to reassess for COPD - Obtaining routine labs for dyspnea (CBC to  rule out anemia, TSH to rule out hypothyroid, BMP to ensure electrolytes are normal and not contributing) - strongly encouraged smoking cessation - follow up after echo and PFTs   PATIENT EDUCATION PROVIDED: See AVS    Diagnosis and plan along with any  newly prescribed medication(s) were discussed in detail with this patient today. The patient verbalized understanding and agreed with the plan. Patient advised if symptoms worsen return to clinic or ER.   Health Maintainance: none   Orders Placed This Encounter  Procedures  . VITAMIN D 25 Hydroxy (Vit-D Deficiency, Fractures)  . TSH + free T4  . CBC with Differential  . Basic Metabolic Panel  . Ambulatory referral to Orthopedic Surgery    Referral Priority:   Routine    Referral Type:   Surgical    Referral Reason:   Specialty Services Required    Requested Specialty:   Orthopedic Surgery    Number of Visits Requested:   1  . Ambulatory referral to Sleep Studies    Referral Priority:   Routine    Referral Type:   Consultation    Referral Reason:   Specialty Services Required    Number of Visits Requested:   1  . ECHOCARDIOGRAM COMPLETE    Standing Status:   Future    Standing Expiration Date:   03/30/2020    Order Specific Question:   Where should this test be performed    Answer:   [facility]    Order Specific Question:   Perflutren DEFINITY (image enhancing agent) should be administered unless hypersensitivity or allergy exist    Answer:   Administer Perflutren    Order Specific Question:   Reason for exam-Echo    Answer:   Dyspnea  786.09 / R06.00  . Pulmonary function test    Standing Status:   Future    Standing Expiration Date:   12/31/2019    Order Specific Question:   Where should this test be performed?    Answer:   Other    Order Specific Question:   Full PFT: includes the following: basic spirometry, spirometry pre & post bronchodilator, diffusion capacity (DLCO), lung volumes    Answer:   Full PFT    Meds  ordered this encounter  Medications  . pravastatin (PRAVACHOL) 40 MG tablet    Sig: Take 1 tablet (40 mg total) by mouth daily.    Dispense:  90 tablet    Refill:  3  . omeprazole (PRILOSEC) 20 MG capsule    Sig: Take 1 capsule (20 mg total) by mouth 2 (two) times daily before a meal.    Dispense:  60 capsule    Refill:  [place], DO 12/30/2018, 8:54 AM PGY-2 [facility]

## 2018-12-30 NOTE — Patient Instructions (Signed)
It was great to see you today! Thank you for letting me participate in your care!  Today, we discussed your new difficulty breathing. This could be due to several things and I have ordered some blood work today to try and help discover the cause. I am also ordering an echocardiogram (ultrasound of your heart). I will also have you schedule PFTs (Pulmonary Function Test) to see if your lungs are still working properly. If any of these are abnormal I will call you and let you know. Lastly, I have put in for another referral to have a sleep study done as this is important and could be a factor in your change in breathing. Please quit smoking and please let me know if I can do anything to help you.  For your back pain we will refer you to an in network provider.  Be well, Harolyn Rutherford, DO PGY-2, [facility]

## 2018-12-31 LAB — BASIC METABOLIC PANEL
BUN/Creatinine Ratio: 14 (ref 9–20)
BUN: 10 mg/dL (ref 6–24)
CO2: 24 mmol/L (ref 20–29)
Calcium: 9.3 mg/dL (ref 8.7–10.2)
Chloride: 103 mmol/L (ref 96–106)
Creatinine, Ser: 0.72 mg/dL — ABNORMAL LOW (ref 0.76–1.27)
GFR calc Af Amer: 121 mL/min/{1.73_m2} (ref >59)
GFR calc non Af Amer: 105 mL/min/{1.73_m2} (ref >59)
Glucose: 87 mg/dL (ref 65–99)
Potassium: 4.4 mmol/L (ref 3.5–5.2)
Sodium: 141 mmol/L (ref 134–144)

## 2018-12-31 LAB — CBC WITH DIFFERENTIAL/PLATELET
Basophils Absolute: 0.1 10*3/uL (ref 0.0–0.2)
Basos: 1 %
EOS (ABSOLUTE): 0.1 10*3/uL (ref 0.0–0.4)
Eos: 2 %
Hematocrit: 44.2 % (ref 37.5–51.0)
Hemoglobin: 14.6 g/dL (ref 13.0–17.7)
Immature Grans (Abs): 0.1 10*3/uL (ref 0.0–0.1)
Immature Granulocytes: 1 %
Lymphocytes Absolute: 2.4 10*3/uL (ref 0.7–3.1)
Lymphs: 48 %
MCH: 29.7 pg (ref 26.6–33.0)
MCHC: 33 g/dL (ref 31.5–35.7)
MCV: 90 fL (ref 79–97)
Monocytes Absolute: 0.5 10*3/uL (ref 0.1–0.9)
Monocytes: 10 %
Neutrophils Absolute: 1.8 10*3/uL (ref 1.4–7.0)
Neutrophils: 38 %
Platelets: 233 10*3/uL (ref 150–450)
RBC: 4.92 x10E6/uL (ref 4.14–5.80)
RDW: 13.2 % (ref 11.6–15.4)
WBC: 4.9 10*3/uL (ref 3.4–10.8)

## 2018-12-31 LAB — TSH+FREE T4
Free T4: 0.92 ng/dL (ref 0.82–1.77)
TSH: 2.44 u[IU]/mL (ref 0.450–4.500)

## 2018-12-31 LAB — VITAMIN D 25 HYDROXY (VIT D DEFICIENCY, FRACTURES): Vit D, 25-Hydroxy: 9.1 ng/mL — ABNORMAL LOW (ref 30.0–100.0)

## 2019-01-01 DIAGNOSIS — R0609 Other forms of dyspnea: Secondary | ICD-10-CM

## 2019-01-01 DIAGNOSIS — R0600 Dyspnea, unspecified: Secondary | ICD-10-CM | POA: Insufficient documentation

## 2019-01-01 NOTE — Assessment & Plan Note (Signed)
Low again. Will call patient and notify and restart 50,000U weekly for another 8 weeks

## 2019-01-01 NOTE — Assessment & Plan Note (Signed)
Referral to Ortho as conservative measures have not given much relief. He could not go before b/c the specific provider I referred him to was out of network and so too costly. - Made another referral to an in-network provider

## 2019-01-01 NOTE — Assessment & Plan Note (Addendum)
Differential diagnosis includes new onset CHF vs COPD as the two most likely candidates. I favor COPD given that he has a long history of smoking, no chest pain, no swelling or edema in his LE, and overall his fluid status appears normal. - ECHO to rule out CHF - PFTs to reassess for COPD - Obtaining routine labs for dyspnea (CBC to rule out anemia, TSH to rule out hypothyroid, BMP to ensure electrolytes are normal and not contributing) - strongly encouraged smoking cessation - follow up after echo and PFTs

## 2019-01-05 ENCOUNTER — Encounter (INDEPENDENT_AMBULATORY_CARE_PROVIDER_SITE_OTHER): Payer: Self-pay | Admitting: Family Medicine

## 2019-01-05 ENCOUNTER — Ambulatory Visit (INDEPENDENT_AMBULATORY_CARE_PROVIDER_SITE_OTHER): Payer: BLUE CROSS/BLUE SHIELD | Admitting: Family Medicine

## 2019-01-05 DIAGNOSIS — Z720 Tobacco use: Secondary | ICD-10-CM | POA: Diagnosis not present

## 2019-01-05 DIAGNOSIS — E559 Vitamin D deficiency, unspecified: Secondary | ICD-10-CM

## 2019-01-05 DIAGNOSIS — G8929 Other chronic pain: Secondary | ICD-10-CM | POA: Diagnosis not present

## 2019-01-05 DIAGNOSIS — M545 Low back pain: Secondary | ICD-10-CM | POA: Diagnosis not present

## 2019-01-05 MED ORDER — VITAMIN D-3 125 MCG (5000 UT) PO TABS: ORAL_TABLET | ORAL | 1 refills | Status: AC

## 2019-01-05 MED ORDER — ETODOLAC 400 MG PO TABS: 400.0000 mg | ORAL_TABLET | Freq: Two times a day (BID) | ORAL | 3 refills | Status: AC | PRN

## 2019-01-05 NOTE — Progress Notes (Signed)
Office Visit Note   Patient: Preston Sullivan           Date of Birth: May 28, 1966           MRN: [id number] Visit Date: 01/05/2019 Requested by: McDiarmid, Blane Ohara, MD [address] [place], [place] [address] PCP: Nuala Alpha, DO  Subjective: Chief Complaint  Patient presents with  . Lower Back - Pain    Chronic pain. Worse first morning when first stands up out of bed. Gets better as he moves around throughout the day. Occasionally radiates down both thighs (laterally). Occasional numbness/tingling both legs/feet.    HPI: He is a 53 year old with low back pain.  Longstanding problems with his back, at least 2 or 3 years but probably longer than that.  There are x-rays in his chart from 2010.  He states that he has had ongoing intermittent pain in his back which is gotten significantly worse in the past year or 2.  Midline pain radiating out to the sides and occasionally into his legs.  He gets occasional tingling in his feet but has not noticed any weakness, no bowel or bladder dysfunction.  He has tried ibuprofen with minimal relief.  He avoids muscle relaxants because he works 2 jobs and cannot afford to be drowsy.  He states that 1 of his job is very physically demanding and he might quit that job if he can get his pain under control.  Pain is worst when he first stands up after sitting or lying down, it gets better after he warms up.  He also has a history of vitamin D deficiency.  His most recent level was severely low at 9.  He is a smoker but is quitting with the help of Chantix.  He is making steady progress with this.  He has hyperlipidemia for which he takes Pravachol for the past couple months.                ROS: All other systems were reviewed and are negative as pertains to the chief complaint.  Objective: Vital Signs: There were no vitals taken for this visit.  Physical Exam:  Low back: No visible scoliosis, good flexibility.  No tenderness over the spinous  processes or the SI joints.  He is moderately tender in the lower lumbar paraspinous muscles bilaterally and has some symptomatic trigger points.  No pain in the sciatic notch areas, negative straight leg raise.  Lower extremity strength and reflexes are normal.  Imaging: None today.  Previous x-rays were reviewed on computer and reveal no congenital deformities, well-preserved disc spaces, normal alignment, no significant facet joint arthropathy.  At that T11-12 level there is slight disc degeneration.  Assessment & Plan: 1.  Chronic low back pain, probably mechanical, but with occasional paresthesias which could represent a lumbar disc bulge.  Neurologic exam is nonfocal. -We will try physical therapy, [place] as needed.  If he fails to improve we might proceed with MRI scan.  2.  Vitamin D deficiency -This could be contributing to his chronic pain.  Target levels would be 50-80.  I suggested getting a little bit more aggressive with his treatment so we will have him take vitamin D3 at 10,000 IU daily for 3 months and then 5000 daily after that.  Would recheck his levels in 3 to 6 months.  3.  Tobacco abuse -Applauded his efforts at smoking cessation.  Emphasized the importance of this as smoking contributes to degenerative disc disease.   Follow-Up  Instructions: Return in about 4 weeks (around 02/02/2019), or if symptoms worsen or fail to improve.      Procedures: No procedures performed  No notes on file    PMFS History: Patient Active Problem List   Diagnosis Date Noted  . Dyspnea on exertion 01/01/2019  . Need for immunization against influenza 10/22/2018  . Mouth ulcer 08/15/2018  . Marijuana use, continuous 04/07/2018  . Left lower quadrant abdominal tenderness with rebound tenderness 04/07/2018  . Strain of adductor muscle of thigh 04/07/2018  . Healthcare maintenance 01/22/2018  . Epididymitis 01/01/2018  . Other emphysema ([place]) 12/23/2017  . Unstable angina ([place])  11/10/2016  . SHOULDER PAIN, BILATERAL 08/09/2010  . Vitamin D deficiency 12/13/2009  . GLUCOSE INTOLERANCE 12/13/2009  . DENTAL CARIES 12/13/2009  . Backache 12/13/2009  . LIVER PAIN 11/29/2009  . TOBACCO USER 10/31/2009  . DEPRESSION 10/31/2009  . ALLERGIC RHINITIS 10/31/2009  . GERD 10/31/2009   Past Medical History:  Diagnosis Date  . Arthritis   . GERD (gastroesophageal reflux disease)     Family History  Problem Relation Age of Onset  . Colon cancer Neg Hx   . Esophageal cancer Neg Hx   . Liver cancer Neg Hx   . Pancreatic cancer Neg Hx   . Rectal cancer Neg Hx   . Stomach cancer Neg Hx     Past Surgical History:  Procedure Laterality Date  . APPENDECTOMY     Social History   Occupational History  . Not on file  Tobacco Use  . Smoking status: Former Smoker    Packs/day: 1.00    Years: 42.00    Pack years: 42.00    Types: Cigarettes    Start date: 12/11/1975    Last attempt to quit: 12/30/2017    Years since quitting: 1.0  . Smokeless tobacco: Never Used  Substance and Sexual Activity  . Alcohol use: Yes    Comment: soc  . Drug use: Yes    Types: Marijuana  . Sexual activity: Not on file

## 2019-01-08 ENCOUNTER — Telehealth: Payer: Self-pay | Admitting: Family Medicine

## 2019-01-08 NOTE — Telephone Encounter (Signed)
Called patient to ensure he picked up Vit D from pharmacy as his last level was very low. He plans to pick it up tomorrow.   He has been to see the orthopedist for low back pain and is awaiting a call from physical therapy. He is agreeable to going.

## 2019-01-09 ENCOUNTER — Other Ambulatory Visit (HOSPITAL_COMMUNITY): Payer: Self-pay

## 2019-01-12 ENCOUNTER — Ambulatory Visit (INDEPENDENT_AMBULATORY_CARE_PROVIDER_SITE_OTHER): Payer: BLUE CROSS/BLUE SHIELD | Admitting: Pharmacist

## 2019-01-12 ENCOUNTER — Encounter: Payer: Self-pay | Admitting: Pharmacist

## 2019-01-12 DIAGNOSIS — R0600 Dyspnea, unspecified: Secondary | ICD-10-CM

## 2019-01-12 DIAGNOSIS — R0609 Other forms of dyspnea: Secondary | ICD-10-CM

## 2019-01-12 DIAGNOSIS — F17200 Nicotine dependence, unspecified, uncomplicated: Secondary | ICD-10-CM

## 2019-01-12 NOTE — Assessment & Plan Note (Signed)
Patient has been experiencing difficulty breathing and is not using any inhalers. Spirometry evaluation reveals normal lung function with no chronic change from last PFTs in 12/2017. -Reviewed results of pulmonary function tests.  Pt verbalized understanding of results and education.  -Counseled patient on the importance of smoking cessation and encouraged patient to continue efforts.  -Continue Chantix 0.5 mg and Nicotine patch 14 mg. Use nicotine gum for cravings.

## 2019-01-12 NOTE — Assessment & Plan Note (Signed)
Chronic tobacco abuse since age 53 and currently attempting quit attempt.   -Counseled patient on the importance of smoking cessation and encouraged patient to continue efforts.  -Continue Chantix 0.5 mg and Nicotine patch 14 mg. Use nicotine gum for cravings.

## 2019-01-12 NOTE — Patient Instructions (Signed)
It was great to see you today!   Your lung function tests were good!   Continue the white pills (0.5 mg) you have of Chantix with your nicotine patch. Take one Chantix in the morning when you wake up and one around 5:30 PM. If you notice that you are having nightmares, you can take the patch off when you sleep.   When you run out of 14 mg nicotine patches, you can buy 7 mg patches. If you have cravings, you can use nicotine gum.   Please follow up with Dr. Garlan Fillers.

## 2019-01-12 NOTE — Progress Notes (Signed)
   S:    Patient arrives in good spirits, accompanied by his wife Preston Sullivan.  Presents for lung function evaluation.   Patient was referred by Dr. Garlan Fillers (referred on 10/21/2018).  Patient was last seen by Primary Care Provider on 12/30/2018.  Patient reports breathing difficulties.   Patient reports adherence to medications. Patient does not use inhalers currently.   O: Physical Exam Vitals signs reviewed.  Constitutional:      Appearance: Normal appearance.  Neurological:     Mental Status: He is alert.    Review of Systems  All other systems reviewed and are negative.    Vitals:   01/12/19 0912  BP: 120/72  Pulse: 75  SpO2: 99%    See "scanned report" or Documentation Flowsheet (discrete results - PFTs) for Spirometry results. Patient provided good effort while attempting spirometry.   Lung Age = 20  A/P: Patient has been experiencing difficulty breathing and is not using any inhalers. Spirometry evaluation reveals normal lung function with no chronic change from last PFTs in 12/2017. -Reviewed results of pulmonary function tests.  Pt verbalized understanding of results and education.  -Counseled patient on the importance of smoking cessation and encouraged patient to continue efforts.  -Continue Chantix 0.5 mg and Nicotine patch 14 mg. Use nicotine gum for cravings.   Written pt instructions provided.  F/U Clinic visit with Dr. Garlan Fillers.    Total time in face to face counseling 30 minutes.  Patient seen with Preston Sullivan, PharmD Candidate.

## 2019-01-12 NOTE — Progress Notes (Signed)
Patient ID: Preston Sullivan, male   DOB: 01-14-1966, 53 y.o.   MRN: [id number] Reviewed: I agree with Dr. Graylin Shiver documentation and management.

## 2019-01-13 ENCOUNTER — Ambulatory Visit (HOSPITAL_COMMUNITY)
Admission: RE | Admit: 2019-01-13 | Discharge: 2019-01-13 | Disposition: A | Discharge status: Home / Self Care | Payer: BLUE CROSS/BLUE SHIELD | Source: Ambulatory Visit | Attending: Family Medicine | Admitting: Family Medicine

## 2019-01-13 DIAGNOSIS — I209 Angina pectoris, unspecified: Secondary | ICD-10-CM | POA: Diagnosis not present

## 2019-01-13 DIAGNOSIS — R0609 Other forms of dyspnea: Secondary | ICD-10-CM | POA: Diagnosis not present

## 2019-01-13 DIAGNOSIS — R0600 Dyspnea, unspecified: Secondary | ICD-10-CM

## 2019-01-13 DIAGNOSIS — F17200 Nicotine dependence, unspecified, uncomplicated: Secondary | ICD-10-CM | POA: Insufficient documentation

## 2019-01-19 ENCOUNTER — Other Ambulatory Visit: Payer: Self-pay

## 2019-01-19 ENCOUNTER — Ambulatory Visit: Payer: BLUE CROSS/BLUE SHIELD | Attending: Family Medicine | Admitting: Physical Therapy

## 2019-01-19 ENCOUNTER — Encounter: Payer: Self-pay | Admitting: Physical Therapy

## 2019-01-19 DIAGNOSIS — M25512 Pain in left shoulder: Secondary | ICD-10-CM | POA: Insufficient documentation

## 2019-01-19 DIAGNOSIS — M545 Low back pain, unspecified: Secondary | ICD-10-CM

## 2019-01-19 DIAGNOSIS — M25511 Pain in right shoulder: Secondary | ICD-10-CM | POA: Insufficient documentation

## 2019-01-19 DIAGNOSIS — M62830 Muscle spasm of back: Secondary | ICD-10-CM | POA: Diagnosis not present

## 2019-01-19 DIAGNOSIS — G8929 Other chronic pain: Secondary | ICD-10-CM | POA: Diagnosis not present

## 2019-01-19 NOTE — Therapy (Signed)
[place], [place], [address] Phone: [phone]   Fax:  [phone]  Physical Therapy Evaluation  Patient Details  Name: Preston Sullivan MRN: [id number] Date of Birth: [birth year] Referring Provider (PT): Dr Eunice Blase    Encounter Date: 01/19/2019  PT End of Session - 01/19/19 1134    Visit Number  1    Number of Visits  16    Date for PT Re-Evaluation  03/16/19    Authorization Type  [employer] 40% co-pay     PT Start Time  0930    PT Stop Time  1013    PT Time Calculation (min)  43 min    Activity Tolerance  Patient tolerated treatment well    Behavior During Therapy  [facility] for tasks assessed/performed       Past Medical History:  Diagnosis Date  . Arthritis   . GERD (gastroesophageal reflux disease)     Past Surgical History:  Procedure Laterality Date  . APPENDECTOMY      There were no vitals filed for this visit.   Subjective Assessment - 01/19/19 0935    Subjective  Patient has a 10 year history of lower back pain. Over the past year it has been increasing. he has pain at work and at night. He had a more strenous job before but he got a new job.     Pertinent History  Sickle Cell anemia; Shoulder artritis bilateral     Limitations  Standing;Walking    How long can you sit comfortably?  Has to put something behind his back     How long can you stand comfortably?  has to stand 8 hours a day but he has pain    How long can you walk comfortably?  community distsances but has pain    Currently in Pain?  Yes    Pain Score  8     Pain Location  Back    Pain Orientation  Right    Pain Descriptors / Indicators  Aching    Pain Type  Chronic pain    Pain Radiating Towards  radiates into bilateral quads     Pain Onset  More than a month ago    Aggravating Factors   sitting and night time     Pain Relieving Factors  Nothing     Effect of Pain on Daily Activities  dififuclty perfroming daily activity      Multiple Pain Sites  No         [facility] PT Assessment - 01/19/19 0001      Assessment   Medical Diagnosis  Low Back Pain     Referring Provider (PT)  Dr Legrand Como Hilts     Onset Date/Surgical Date  --   10 years prior    Hand Dominance  Right    Next MD Visit  See how PT goes     Prior Therapy  None       Precautions   Precautions  None      Restrictions   Weight Bearing Restrictions  No      Balance Screen   Has the patient fallen in the past 6 months  No    Has the patient had a decrease in activity level because of a fear of falling?   No    Is the patient reluctant to leave their home because of a fear of falling?   No      Home Environment  Additional Comments  Nothing pertiannt       Prior Function   Level of Independence  Independent    Vocation  Full time employment    Vocation Requirements  Stands at work for 8 hours     Leisure  Nothing       Cognition   Overall Cognitive Status  Within Functional Limits for tasks assessed    Attention  Focused    Focused Attention  Appears intact    Memory  Appears intact    Awareness  Appears intact    Problem Solving  Appears intact      Observation/Other Assessments   Focus on Therapeutic Outcomes (FOTO)   43% limitation on FOTO       Sensation   Light Touch  Appears Intact    Additional Comments  pain radiates down into bilateral quads       Coordination   Gross Motor Movements are Fluid and Coordinated  Yes    Fine Motor Movements are Fluid and Coordinated  Yes      ROM / Strength   AROM / PROM / Strength  AROM;PROM;Strength      AROM   AROM Assessment Site  Lumbar    Lumbar Flexion  limited 50% with pain. Difficulty coming back up straight    Lumbar Extension  limited 25% with pain     Lumbar - Right Side Bend  limited 50% with pain     Lumbar - Left Side Bend  limited 50% with pain     Lumbar - Right Rotation  `limited 50% with pain     Lumbar - Left Rotation  limited 50% with pain       PROM    Overall PROM Comments  left hip flexion at 88% tight internal and external rotation       Strength   Overall Strength Comments  knee strength 5/5     Strength Assessment Site  Hip    Right/Left Hip  Right;Left    Right Hip Flexion  4+/5    Right Hip ABduction  5/5    Right Hip ADduction  5/5    Left Hip Flexion  4+/5    Left Hip ABduction  5/5    Left Hip ADduction  5/5      Flexibility   Soft Tissue Assessment /Muscle Length  yes    Hamstrings  90/90 left -40 right -35      Palpation   Spinal mobility  decreased PA mobilization :L3 L4 L5     Palpation comment  Significant spasming in the lumbar paraspinals and into the lumbar gluteals       Ambulation/Gait   Gait Comments  decreased hip rotation with gait                 Objective measurements completed on examination: See above findings.      [facility] Adult PT Treatment/Exercise - 01/19/19 0001      Exercises   Exercises  Lumbar      Lumbar Exercises: Stretches   Active Hamstring Stretch Limitations  seated 2x20 sec hold     Lower Trunk Rotation Limitations  x10 each way     Piriformis Stretch Limitations  2x20 sec hold     Other Lumbar Stretch Exercise  tennis ball trigger point release              PT Education - 01/19/19 1059    Education Details  HEP, symptom mangement, improtance of  stretching     Person(s) Educated  Patient    Methods  Explanation;Demonstration;Tactile cues;Verbal cues    Comprehension  Returned demonstration;Verbal cues required;Tactile cues required;Need further instruction;Verbalized understanding       PT Short Term Goals - 01/19/19 1113      PT SHORT TERM GOAL #1   Title  Patient will increase lumbar flexion by 25%     Time  4    Period  Weeks    Status  New    Target Date  02/16/19      PT SHORT TERM GOAL #2   Title  Patient will increase bilateral rotation by 25%     Time  4    Period  Weeks    Target Date  02/09/19      PT SHORT TERM GOAL #3   Title   Patient will report decreased tenderness to palpation in the lumbar paraspinals and gluteals    Time  4    Period  Weeks    Status  New    Target Date  02/16/19        PT Long Term Goals - 01/19/19 1117      PT LONG TERM GOAL #1   Title  Patient will sit for 1 hour and report no stiffness when standing     Time  8    Period  Weeks    Status  New    Target Date  03/16/19      PT LONG TERM GOAL #2   Title  Patient will perfrom full rotation without pain in order to perfrom hygiene tasks     Time  8    Period  Weeks    Status  New      PT LONG TERM GOAL #3   Title  Patient will demonstrate a 33% limitation on FOTO     Time  8    Period  Weeks    Status  New      PT LONG TERM GOAL #4   Title  Patient will be independnet with complete stretching and strengthening progrm     Time  8    Period  Weeks    Status  New    Target Date  03/16/19             Plan - 01/19/19 0947    Clinical Impression Statement  Patient is a 53 year old male with bilateral lower back pain. He has had pain for several years but his pain is increasing. He has increased pain when he sits then stands. He has soignificant spasming in his lumbar spine into his gluteals. He has limitations in bilateral hip mobility. He would benefit from skilled therapy to reduce muslce tightness and increase core and hip strength.,     History and Personal Factors relevant to plan of care:  scikle cell anemia; Bilateral shoulder arthritis;     Clinical Presentation  Evolving    Clinical Decision Making  Moderate    Rehab Potential  Good    PT Frequency  2x / week    PT Duration  8 weeks    PT Treatment/Interventions  ADLs/Self Care Home Management;Cryotherapy;Electrical Stimulation;Iontophoresis 4mg /ml Dexamethasone;Moist Heat;Ultrasound;Traction;Gait training;Stair training;Functional mobility training;Therapeutic exercise;Therapeutic activities;Neuromuscular re-education;Patient/family education;Manual  techniques;Passive range of motion;Dry needling;Splinting;Taping    PT Next Visit Plan  consdier TPDN     PT Home Exercise Plan  LTR, hamstring stretch, piriformis stretch;        Patient will benefit  from skilled therapeutic intervention in order to improve the following deficits and impairments:  Pain, Improper body mechanics, Decreased activity tolerance, Decreased endurance, Decreased range of motion, Decreased strength, Decreased safety awareness, Postural dysfunction, Increased muscle spasms  Visit Diagnosis: Chronic bilateral low back pain without sciatica  Muscle spasm of back     Problem List Patient Active Problem List   Diagnosis Date Noted  . Dyspnea on exertion 01/01/2019  . Need for immunization against influenza 10/22/2018  . Mouth ulcer 08/15/2018  . Marijuana use, continuous 04/07/2018  . Left lower quadrant abdominal tenderness with rebound tenderness 04/07/2018  . Strain of adductor muscle of thigh 04/07/2018  . Healthcare maintenance 01/22/2018  . Epididymitis 01/01/2018  . Other emphysema ([place]) 12/23/2017  . Unstable angina ([place]) 11/10/2016  . SHOULDER PAIN, BILATERAL 08/09/2010  . Vitamin D deficiency 12/13/2009  . GLUCOSE INTOLERANCE 12/13/2009  . DENTAL CARIES 12/13/2009  . Backache 12/13/2009  . LIVER PAIN 11/29/2009  . TOBACCO USER 10/31/2009  . DEPRESSION 10/31/2009  . ALLERGIC RHINITIS 10/31/2009  . GERD 10/31/2009    Carney Living PT DPT  01/19/2019, 11:47 AM  [facility] [address] [place], [place], [address] Phone: [phone]   Fax:  [phone]  Name: Preston Sullivan MRN: [id number] Date of Birth: 1966/01/16

## 2019-01-28 ENCOUNTER — Ambulatory Visit: Payer: BLUE CROSS/BLUE SHIELD | Admitting: Physical Therapy

## 2019-01-28 ENCOUNTER — Telehealth: Payer: Self-pay | Admitting: Physical Therapy

## 2019-01-28 NOTE — Telephone Encounter (Signed)
Called patient about next visit.  He apologized for over sleeping.  He works at night.  He was aware of next appointment date and time.  He was asked to call to cancel if he was unable to attend.  He agreed.  Melvenia Needles PTA

## 2019-01-30 ENCOUNTER — Encounter: Payer: Self-pay | Admitting: Physical Therapy

## 2019-01-30 ENCOUNTER — Ambulatory Visit: Payer: BLUE CROSS/BLUE SHIELD | Admitting: Physical Therapy

## 2019-01-30 DIAGNOSIS — M62830 Muscle spasm of back: Secondary | ICD-10-CM

## 2019-01-30 DIAGNOSIS — M25511 Pain in right shoulder: Secondary | ICD-10-CM | POA: Diagnosis not present

## 2019-01-30 DIAGNOSIS — G8929 Other chronic pain: Secondary | ICD-10-CM | POA: Diagnosis not present

## 2019-01-30 DIAGNOSIS — M25512 Pain in left shoulder: Secondary | ICD-10-CM | POA: Diagnosis not present

## 2019-01-30 DIAGNOSIS — M545 Low back pain: Principal | ICD-10-CM

## 2019-01-30 NOTE — Therapy (Signed)
[place], [place], [address] Phone: [phone]   Fax:  [phone]  Physical Therapy Treatment  Patient Details  Name: Preston Sullivan MRN: [id number] Date of Birth: 1966/03/17 Referring Provider (PT): Dr Eunice Blase    Encounter Date: 01/30/2019  PT End of Session - 01/30/19 0852    Visit Number  2    Number of Visits  16    Date for PT Re-Evaluation  03/16/19    Authorization Type  [employer] 40% co-pay     PT Start Time  0846    PT Stop Time  0938    PT Time Calculation (min)  52 min    Activity Tolerance  Patient tolerated treatment well    Behavior During Therapy  [facility] for tasks assessed/performed       Past Medical History:  Diagnosis Date  . Arthritis   . GERD (gastroesophageal reflux disease)     Past Surgical History:  Procedure Laterality Date  . APPENDECTOMY      There were no vitals filed for this visit.  Subjective Assessment - 01/30/19 0850    Subjective  Patient reports his back has been really sitiff. He had to take Ibuporfen before he got up. He has been working on his stretches which helps.     Pertinent History  Sickle Cell anemia; Shoulder artritis bilateral     Limitations  Standing;Walking    How long can you sit comfortably?  Has to put something behind his back     How long can you stand comfortably?  has to stand 8 hours a day but he has pain    How long can you walk comfortably?  community distsances but has pain    Currently in Pain?  Yes    Pain Score  8     Pain Location  Back    Pain Orientation  Right    Pain Descriptors / Indicators  Aching    Pain Type  Chronic pain    Pain Onset  More than a month ago    Pain Frequency  Constant    Aggravating Factors   sitting at night     Pain Relieving Factors  nothing     Effect of Pain on Daily Activities  difficulty perfroming daily activity                        [facility] Adult PT Treatment/Exercise -  01/30/19 0001      Lumbar Exercises: Stretches   Active Hamstring Stretch Limitations  seated 2x20 sec hold     Lower Trunk Rotation Limitations  x10 each way     Piriformis Stretch Limitations  2x20 sec hold       Lumbar Exercises: Seated   Other Seated Lumbar Exercises  ball squeeze with cuing for posture and breathing 2x10; clamshell seated with cuing for posture 2x10       Modalities   Modalities  Moist Heat      Moist Heat Therapy   Number Minutes Moist Heat  10 Minutes    Moist Heat Location  Lumbar Spine             PT Education - 01/30/19 7989    Education Details  reviewed HEP, symptom mangement, use of the brace     Person(s) Educated  Patient    Methods  Explanation;Demonstration;Tactile cues;Verbal cues    Comprehension  Verbalized understanding;Returned demonstration;Verbal cues required;Tactile cues required  PT Short Term Goals - 01/30/19 1359      PT SHORT TERM GOAL #1   Title  Patient will increase lumbar flexion by 25%     Time  4    Period  Weeks    Status  On-going      PT SHORT TERM GOAL #2   Title  Patient will increase bilateral rotation by 25%     Time  4    Period  Weeks    Status  On-going      PT SHORT TERM GOAL #3   Title  Patient will report decreased tenderness to palpation in the lumbar paraspinals and gluteals    Time  4    Period  Weeks    Status  On-going        PT Long Term Goals - 01/19/19 1117      PT LONG TERM GOAL #1   Title  Patient will sit for 1 hour and report no stiffness when standing     Time  8    Period  Weeks    Status  New    Target Date  03/16/19      PT LONG TERM GOAL #2   Title  Patient will perfrom full rotation without pain in order to perfrom hygiene tasks     Time  8    Period  Weeks    Status  New      PT LONG TERM GOAL #3   Title  Patient will demonstrate a 33% limitation on FOTO     Time  8    Period  Weeks    Status  New      PT LONG TERM GOAL #4   Title  Patient will be  independnet with complete stretching and strengthening progrm     Time  8    Period  Weeks    Status  New    Target Date  03/16/19            Plan - 01/30/19 1350    Clinical Impression Statement  Patient was given light strengthening exercises. He did well with them. he required min cuing. He has signiifcant spasming in his lewoer back. Therapy focused on manual trigger point release and LAD to decrease compression on his spine. He reports his back is still stiff but he has only had his initial eval. Therapy advised him this will take time. Therapy talked to him about TPDN but he is not sure if he wants it.     Clinical Presentation  Evolving    Clinical Decision Making  Moderate    Rehab Potential  Good    PT Frequency  2x / week    PT Duration  8 weeks    PT Treatment/Interventions  ADLs/Self Care Home Management;Cryotherapy;Electrical Stimulation;Iontophoresis 4mg /ml Dexamethasone;Moist Heat;Ultrasound;Traction;Gait training;Stair training;Functional mobility training;Therapeutic exercise;Therapeutic activities;Neuromuscular re-education;Patient/family education;Manual techniques;Passive range of motion;Dry needling;Splinting;Taping    PT Next Visit Plan  continue to progress core strengthening, consider manual therapy.     PT Home Exercise Plan  LTR, hamstring stretch, piriformis stretch;     Consulted and Agree with Plan of Care  Patient       Patient will benefit from skilled therapeutic intervention in order to improve the following deficits and impairments:  Pain, Improper body mechanics, Decreased activity tolerance, Decreased endurance, Decreased range of motion, Decreased strength, Decreased safety awareness, Postural dysfunction, Increased muscle spasms  Visit Diagnosis: Chronic bilateral low back pain without sciatica  Muscle spasm of back  Chronic left shoulder pain  Chronic right shoulder pain     Problem List Patient Active Problem List   Diagnosis Date  Noted  . Dyspnea on exertion 01/01/2019  . Need for immunization against influenza 10/22/2018  . Mouth ulcer 08/15/2018  . Marijuana use, continuous 04/07/2018  . Left lower quadrant abdominal tenderness with rebound tenderness 04/07/2018  . Strain of adductor muscle of thigh 04/07/2018  . Healthcare maintenance 01/22/2018  . Epididymitis 01/01/2018  . Other emphysema ([place]) 12/23/2017  . Unstable angina ([place]) 11/10/2016  . SHOULDER PAIN, BILATERAL 08/09/2010  . Vitamin D deficiency 12/13/2009  . GLUCOSE INTOLERANCE 12/13/2009  . DENTAL CARIES 12/13/2009  . Backache 12/13/2009  . LIVER PAIN 11/29/2009  . TOBACCO USER 10/31/2009  . DEPRESSION 10/31/2009  . ALLERGIC RHINITIS 10/31/2009  . GERD 10/31/2009    Carney Living 01/30/2019, 2:00 PM  [facility] [address] [place], [place], [address] Phone: [phone]   Fax:  [phone]  Name: Jerick Khachatryan MRN: [id number] Date of Birth: February 27, 1966

## 2019-02-02 ENCOUNTER — Ambulatory Visit: Payer: BLUE CROSS/BLUE SHIELD | Admitting: Physical Therapy

## 2019-02-03 ENCOUNTER — Ambulatory Visit (INDEPENDENT_AMBULATORY_CARE_PROVIDER_SITE_OTHER): Payer: BLUE CROSS/BLUE SHIELD | Admitting: Family Medicine

## 2019-02-03 ENCOUNTER — Encounter (INDEPENDENT_AMBULATORY_CARE_PROVIDER_SITE_OTHER): Payer: Self-pay | Admitting: Family Medicine

## 2019-02-03 DIAGNOSIS — G8929 Other chronic pain: Secondary | ICD-10-CM | POA: Diagnosis not present

## 2019-02-03 DIAGNOSIS — M545 Low back pain, unspecified: Secondary | ICD-10-CM

## 2019-02-03 MED ORDER — MELOXICAM 15 MG PO TABS: 7.5000 mg | ORAL_TABLET | Freq: Every day | ORAL | 6 refills | Status: AC | PRN

## 2019-02-03 NOTE — Progress Notes (Signed)
   Office Visit Note   Patient: Preston Sullivan           Date of Birth: December 27, 1965           MRN: [id number] Visit Date: 02/03/2019 Requested by: Nuala Alpha, DO [address], [place] [address] PCP: Nuala Alpha, DO  Subjective: Chief Complaint  Patient presents with  . Lower Back - Pain, Follow-up    No improvement with PT.  [place] Neck - Pain    Pain x 2 days, left side. No radiating pain. No numbness/tingling.    HPI: He is here for follow-up chronic low back pain.  He is making a little bit of progress in physical therapy, getting ready to start dry needling.  Pain is mostly on the left lower back, no significant radicular pain.  He would like to try different anti-inflammatory.  Apparently the other one was too expensive.              ROS: Noncontributory  Objective: Vital Signs: There were no vitals taken for this visit.  Physical Exam:  Low back: Tender near the left quadratus lumborum and in the gluteus medius region.  Lower extremity strength and reflexes are normal.  Imaging: None today.  Assessment & Plan: 1.  Slightly improved chronic low back pain -Proceed with dry needling.  If no relief then MRI scan.     Procedures: No procedures performed  No notes on file     PMFS History: Patient Active Problem List   Diagnosis Date Noted  . Dyspnea on exertion 01/01/2019  . Need for immunization against influenza 10/22/2018  . Mouth ulcer 08/15/2018  . Marijuana use, continuous 04/07/2018  . Left lower quadrant abdominal tenderness with rebound tenderness 04/07/2018  . Strain of adductor muscle of thigh 04/07/2018  . Healthcare maintenance 01/22/2018  . Epididymitis 01/01/2018  . Other emphysema ([place]) 12/23/2017  . Unstable angina ([place]) 11/10/2016  . SHOULDER PAIN, BILATERAL 08/09/2010  . Vitamin D deficiency 12/13/2009  . GLUCOSE INTOLERANCE 12/13/2009  . DENTAL CARIES 12/13/2009  . Backache 12/13/2009  . LIVER PAIN 11/29/2009  .  TOBACCO USER 10/31/2009  . DEPRESSION 10/31/2009  . ALLERGIC RHINITIS 10/31/2009  . GERD 10/31/2009   Past Medical History:  Diagnosis Date  . Arthritis   . GERD (gastroesophageal reflux disease)     Family History  Problem Relation Age of Onset  . Colon cancer Neg Hx   . Esophageal cancer Neg Hx   . Liver cancer Neg Hx   . Pancreatic cancer Neg Hx   . Rectal cancer Neg Hx   . Stomach cancer Neg Hx     Past Surgical History:  Procedure Laterality Date  . APPENDECTOMY     Social History   Occupational History  . Not on file  Tobacco Use  . Smoking status: Former Smoker    Packs/day: 1.00    Years: 42.00    Pack years: 42.00    Types: Cigarettes    Start date: 12/11/1975    Last attempt to quit: 12/30/2017    Years since quitting: 1.0  . Smokeless tobacco: Never Used  . Tobacco comment: 5 cigarettes per day as of 01/12/2019  Substance and Sexual Activity  . Alcohol use: Yes    Comment: soc  . Drug use: Yes    Types: Marijuana  . Sexual activity: Not on file

## 2019-02-05 ENCOUNTER — Ambulatory Visit: Payer: BLUE CROSS/BLUE SHIELD | Admitting: Physical Therapy

## 2019-02-09 ENCOUNTER — Ambulatory Visit: Payer: BLUE CROSS/BLUE SHIELD | Attending: Family Medicine | Admitting: Physical Therapy

## 2019-02-09 ENCOUNTER — Encounter: Payer: Self-pay | Admitting: Physical Therapy

## 2019-02-09 DIAGNOSIS — G8929 Other chronic pain: Secondary | ICD-10-CM | POA: Insufficient documentation

## 2019-02-09 DIAGNOSIS — M545 Low back pain: Secondary | ICD-10-CM | POA: Insufficient documentation

## 2019-02-09 DIAGNOSIS — M25511 Pain in right shoulder: Secondary | ICD-10-CM | POA: Insufficient documentation

## 2019-02-09 DIAGNOSIS — M62830 Muscle spasm of back: Secondary | ICD-10-CM | POA: Insufficient documentation

## 2019-02-09 DIAGNOSIS — M25512 Pain in left shoulder: Secondary | ICD-10-CM | POA: Insufficient documentation

## 2019-02-09 NOTE — Patient Instructions (Signed)

## 2019-02-09 NOTE — Therapy (Signed)
[place], [place], [address] Phone: [phone]   Fax:  [phone]  Physical Therapy Treatment  Patient Details  Name: Preston Sullivan MRN: [id number] Date of Birth: 05-07-1966 Referring Provider (PT): Dr Eunice Blase    Encounter Date: 02/09/2019  PT End of Session - 02/09/19 1830    Visit Number  3    Number of Visits  16    Date for PT Re-Evaluation  03/16/19    Authorization Type  [employer] 40% co-pay     PT Start Time  0937    PT Stop Time  1015    PT Time Calculation (min)  38 min    Activity Tolerance  Patient tolerated treatment well    Behavior During Therapy  [facility] for tasks assessed/performed       Past Medical History:  Diagnosis Date  . Arthritis   . GERD (gastroesophageal reflux disease)     Past Surgical History:  Procedure Laterality Date  . APPENDECTOMY      There were no vitals filed for this visit.  Subjective Assessment - 02/09/19 0938    Subjective  I feel a lot looser.  I have been doing the exercises.  I get a sleep study this week.  Pain comes and goes.  I have been more active. No more leg pain.   I have been using the  ball  and it helps.   I have been doing the mini crunches to get my abdominals in shape.      Currently in Pain?  No/denies    Pain Score  0-No pain   up to 6 or 5/10   Pain Location  Back    Pain Orientation  Right    Pain Descriptors / Indicators  Tightness    Pain Radiating Towards  No    Pain Frequency  Intermittent    Aggravating Factors   first thing in am  sometimes.  Standing at work 8 hours at work.     Pain Relieving Factors  moving around more.  Having a weekend off.  Antiinflamatories.     Effect of Pain on Daily Activities  ADL difficult    Multiple Pain Sites  No                       [facility] Adult PT Treatment/Exercise - 02/09/19 0001      Self-Care   Self-Care  ADL's    ADL's  handout issued reviewed transfers in out bed,   car laundry .  needs more education/  practice when ready      Lumbar Exercises: Stretches   Active Hamstring Stretch Limitations  seated 2x20 sec hold     Lower Trunk Rotation  10 seconds;3 reps    Piriformis Stretch  --   both   Piriformis Stretch Limitations  2 X 20  sitting both      Lumbar Exercises: Supine   Ab Set  5 reps    AB Set Limitations  with arm press into mat,  cues.  HEP    Pelvic Tilt  10 reps    Pelvic Tilt Limitations  mod cues initially    Bent Knee Raise  5 reps    Bent Knee Raise Limitations  cued to lift only 3 inches off mat then hold 5 seconds,  alternate  with stomach tightened    Dead Bug  5 seconds;5 reps    Dead Bug Limitations  moderate cues    Bridge  10 reps    Bridge Limitations  HEP    Other Supine Lumbar Exercises  with stomach tightened,  take small steps away from body until back just starts to leave mat,  then take small steps back   holding abdominals braced,  needed cues for breathing and technique.     Other Supine Lumbar Exercises  Hooklying arms to 90,   tighten stomach muscles,  move one arm along head until you feel your back arch hold the bring arm back to starting.  2 reps stopped as this increased his pain.               PT Education - 02/09/19 1004    Education Details  HEP,  Self  care    Person(s) Educated  Patient    Methods  Explanation;Demonstration;Tactile cues;Verbal cues;Handout    Comprehension  Returned demonstration;Verbalized understanding;Need further instruction       PT Short Term Goals - 01/30/19 1359      PT SHORT TERM GOAL #1   Title  Patient will increase lumbar flexion by 25%     Time  4    Period  Weeks    Status  On-going      PT SHORT TERM GOAL #2   Title  Patient will increase bilateral rotation by 25%     Time  4    Period  Weeks    Status  On-going      PT SHORT TERM GOAL #3   Title  Patient will report decreased tenderness to palpation in the lumbar paraspinals and gluteals    Time  4     Period  Weeks    Status  On-going        PT Long Term Goals - 01/19/19 1117      PT LONG TERM GOAL #1   Title  Patient will sit for 1 hour and report no stiffness when standing     Time  8    Period  Weeks    Status  New    Target Date  03/16/19      PT LONG TERM GOAL #2   Title  Patient will perfrom full rotation without pain in order to perfrom hygiene tasks     Time  8    Period  Weeks    Status  New      PT LONG TERM GOAL #3   Title  Patient will demonstrate a 33% limitation on FOTO     Time  8    Period  Weeks    Status  New      PT LONG TERM GOAL #4   Title  Patient will be independnet with complete stretching and strengthening progrm     Time  8    Period  Weeks    Status  New    Target Date  03/16/19            Plan - 02/09/19 1831    Clinical Impression Statement  Progress toward pain goals. He was able to progress HEP today,  Mild increase pain noted by patient with some challanging exercises.  pain ased with brief rests and change of position.  Patient requires  cues with exercise.  He will study the ADL handout and ask questions next visit.      PT Next Visit Plan  continue to progress core strengthening, consider manual therapy. Answer ADL questions.  PT Home Exercise Plan  LTR, hamstring stretch, piriformis stretch; abdominal tightness,  small bent knee and hold,  bridge,  dead bug with bent knee,  small steps away and back with abdominal tightness in supine.     Consulted and Agree with Plan of Care  Patient       Patient will benefit from skilled therapeutic intervention in order to improve the following deficits and impairments:     Visit Diagnosis: Chronic bilateral low back pain without sciatica  Muscle spasm of back  Chronic left shoulder pain  Chronic right shoulder pain     Problem List Patient Active Problem List   Diagnosis Date Noted  . Dyspnea on exertion 01/01/2019  . Need for immunization against influenza  10/22/2018  . Mouth ulcer 08/15/2018  . Marijuana use, continuous 04/07/2018  . Left lower quadrant abdominal tenderness with rebound tenderness 04/07/2018  . Strain of adductor muscle of thigh 04/07/2018  . Healthcare maintenance 01/22/2018  . Epididymitis 01/01/2018  . Other emphysema ([place]) 12/23/2017  . Unstable angina ([place]) 11/10/2016  . SHOULDER PAIN, BILATERAL 08/09/2010  . Vitamin D deficiency 12/13/2009  . GLUCOSE INTOLERANCE 12/13/2009  . DENTAL CARIES 12/13/2009  . Backache 12/13/2009  . LIVER PAIN 11/29/2009  . TOBACCO USER 10/31/2009  . DEPRESSION 10/31/2009  . ALLERGIC RHINITIS 10/31/2009  . GERD 10/31/2009    Demeka Sutter PTA 02/09/2019, 6:35 PM  [facility] [address] [place], [place], [address] Phone: [phone]   Fax:  [phone]  Name: Laurance Heide MRN: [id number] Date of Birth: 1966-07-21

## 2019-02-11 ENCOUNTER — Ambulatory Visit: Payer: BLUE CROSS/BLUE SHIELD | Admitting: Physical Therapy

## 2019-02-11 ENCOUNTER — Encounter: Payer: Self-pay | Admitting: Physical Therapy

## 2019-02-11 DIAGNOSIS — M25511 Pain in right shoulder: Secondary | ICD-10-CM

## 2019-02-11 DIAGNOSIS — M545 Low back pain, unspecified: Secondary | ICD-10-CM

## 2019-02-11 DIAGNOSIS — M62830 Muscle spasm of back: Secondary | ICD-10-CM

## 2019-02-11 DIAGNOSIS — M25512 Pain in left shoulder: Secondary | ICD-10-CM

## 2019-02-11 DIAGNOSIS — G8929 Other chronic pain: Secondary | ICD-10-CM

## 2019-02-11 NOTE — Therapy (Addendum)
[place], [place], [address] Phone: [phone]   Fax:  [phone]  Physical Therapy Treatment/Discharge   Patient Details  Name: Preston Sullivan MRN: [id number] Date of Birth: [birth year] Referring Provider (PT): Dr Eunice Blase    Encounter Date: 02/11/2019  PT End of Session - 02/11/19 1007    Visit Number  4    Number of Visits  16    Date for PT Re-Evaluation  03/16/19    Authorization Type  [employer] 40% co-pay     PT Start Time  0935   therapist late getting patient    PT Stop Time  1016    PT Time Calculation (min)  41 min    Activity Tolerance  Patient tolerated treatment well    Behavior During Therapy  [facility] for tasks assessed/performed       Past Medical History:  Diagnosis Date  . Arthritis   . GERD (gastroesophageal reflux disease)     Past Surgical History:  Procedure Laterality Date  . APPENDECTOMY      There were no vitals filed for this visit.  Subjective Assessment - 02/11/19 1003    Subjective  Patient reports his pain is increased this morming. He had to do a lot of lifting at work this morning. he is having pain in his left WL area.     Pertinent History  Sickle Cell anemia; Shoulder artritis bilateral     Limitations  Standing;Walking    How long can you sit comfortably?  Has to put something behind his back     How long can you stand comfortably?  has to stand 8 hours a day but he has pain    How long can you walk comfortably?  community distsances but has pain    Currently in Pain?  Yes    Pain Score  7     Pain Location  Back    Pain Orientation  Left    Pain Descriptors / Indicators  Aching    Pain Type  Chronic pain    Pain Onset  More than a month ago    Pain Frequency  Constant    Aggravating Factors   first thing in the morning     Pain Relieving Factors  Moving     Effect of Pain on Daily Activities  ADL difficulty                           Trigger Point Dry Needling - 02/11/19 0001    Consent Given?  Yes    Education Handout Provided  Yes    Muscles Treated Back/Hip  Quadratus lumborum    Other Dry Needling  40x120 needle used in 3 spots of the left quadratus     Quadratus Lumborum Response  Twitch response elicited           PT Education - 02/11/19 1007    Education Details  HEP, symptom mangement     Person(s) Educated  Patient    Methods  Explanation;Demonstration;Tactile cues;Verbal cues    Comprehension  Verbalized understanding;Returned demonstration;Verbal cues required;Tactile cues required       PT Short Term Goals - 01/30/19 1359      PT SHORT TERM GOAL #1   Title  Patient will increase lumbar flexion by 25%     Time  4    Period  Weeks    Status  On-going  PT SHORT TERM GOAL #2   Title  Patient will increase bilateral rotation by 25%     Time  4    Period  Weeks    Status  On-going      PT SHORT TERM GOAL #3   Title  Patient will report decreased tenderness to palpation in the lumbar paraspinals and gluteals    Time  4    Period  Weeks    Status  On-going        PT Long Term Goals - 01/19/19 1117      PT LONG TERM GOAL #1   Title  Patient will sit for 1 hour and report no stiffness when standing     Time  8    Period  Weeks    Status  New    Target Date  03/16/19      PT LONG TERM GOAL #2   Title  Patient will perfrom full rotation without pain in order to perfrom hygiene tasks     Time  8    Period  Weeks    Status  New      PT LONG TERM GOAL #3   Title  Patient will demonstrate a 33% limitation on FOTO     Time  8    Period  Weeks    Status  New      PT LONG TERM GOAL #4   Title  Patient will be independnet with complete stretching and strengthening progrm     Time  8    Period  Weeks    Status  New    Target Date  03/16/19            Plan - 02/11/19 1014    Clinical Impression Statement  Patient tolerated trigger  point dry needling well. Therapy got 2 good twitch repsoses in his QL. He reported feeling loosser after manual therapy and needling. He has been complaint with his exercises at home. He continues to report good days and bad days. he was Cecille [place] this is normal but to look for more good days.     Clinical Decision Making  Moderate    Rehab Potential  Good    PT Treatment/Interventions  ADLs/Self Care Home Management;Cryotherapy;Electrical Stimulation;Iontophoresis 64m/ml Dexamethasone;Moist Heat;Ultrasound;Traction;Gait training;Stair training;Functional mobility training;Therapeutic exercise;Therapeutic activities;Neuromuscular re-education;Patient/family education;Manual techniques;Passive range of motion;Dry needling;Splinting;Taping    PT Next Visit Plan  continue to progress core strengthening, consider manual therapy. Answer ADL questions.      PT Home Exercise Plan  LTR, hamstring stretch, piriformis stretch; abdominal tightness,  small bent knee and hold,  bridge,  dead bug with bent knee,  small steps away and back with abdominal tightness in supine.     Consulted and Agree with Plan of Care  Patient       Patient will benefit from skilled therapeutic intervention in order to improve the following deficits and impairments:  Pain, Improper body mechanics, Decreased activity tolerance, Decreased endurance, Decreased range of motion, Decreased strength, Decreased safety awareness, Postural dysfunction, Increased muscle spasms  Visit Diagnosis: Chronic bilateral low back pain without sciatica  Muscle spasm of back  Chronic left shoulder pain  Chronic right shoulder pain     Problem List Patient Active Problem List   Diagnosis Date Noted  . Dyspnea on exertion 01/01/2019  . Need for immunization against influenza 10/22/2018  . Mouth ulcer 08/15/2018  . Marijuana use, continuous 04/07/2018  . Left lower quadrant abdominal tenderness with rebound tenderness  04/07/2018  . Strain of  adductor muscle of thigh 04/07/2018  . Healthcare maintenance 01/22/2018  . Epididymitis 01/01/2018  . Other emphysema ([place]) 12/23/2017  . Unstable angina ([place]) 11/10/2016  . SHOULDER PAIN, BILATERAL 08/09/2010  . Vitamin D deficiency 12/13/2009  . GLUCOSE INTOLERANCE 12/13/2009  . DENTAL CARIES 12/13/2009  . Backache 12/13/2009  . LIVER PAIN 11/29/2009  . TOBACCO USER 10/31/2009  . DEPRESSION 10/31/2009  . ALLERGIC RHINITIS 10/31/2009  . GERD 10/31/2009   PHYSICAL THERAPY DISCHARGE SUMMARY  Visits from Start of Care: 4 Current functional level related to goals / functional outcomes: Discharged 2nd to no-shows  Remaining deficits: Difficulty performing ADL's  Education / Equipment: HEP  Plan: Patient agrees to discharge.  Patient goals were met. Patient is being discharged due to meeting the stated rehab goals.  ?????      Carney Living  PT DPT  02/11/2019, 9:13 PM  [facility] [address] [place], [place], [address] Phone: [phone]   Fax:  [phone]  Name: Preston Sullivan MRN: [id number] Date of Birth: 21-May-1966

## 2019-02-12 ENCOUNTER — Encounter: Payer: Self-pay | Admitting: Neurology

## 2019-02-12 ENCOUNTER — Ambulatory Visit (INDEPENDENT_AMBULATORY_CARE_PROVIDER_SITE_OTHER): Payer: BLUE CROSS/BLUE SHIELD | Admitting: Neurology

## 2019-02-12 VITALS — BP 114/71 | HR 66 | Ht 67.0 in | Wt 205.0 lb

## 2019-02-12 DIAGNOSIS — G4730 Sleep apnea, unspecified: Secondary | ICD-10-CM

## 2019-02-12 DIAGNOSIS — I200 Unstable angina: Secondary | ICD-10-CM

## 2019-02-12 DIAGNOSIS — G4733 Obstructive sleep apnea (adult) (pediatric): Secondary | ICD-10-CM | POA: Diagnosis not present

## 2019-02-12 DIAGNOSIS — R0609 Other forms of dyspnea: Secondary | ICD-10-CM | POA: Diagnosis not present

## 2019-02-12 DIAGNOSIS — F17200 Nicotine dependence, unspecified, uncomplicated: Secondary | ICD-10-CM

## 2019-02-12 DIAGNOSIS — R0600 Dyspnea, unspecified: Secondary | ICD-10-CM

## 2019-02-12 DIAGNOSIS — Z72820 Sleep deprivation: Secondary | ICD-10-CM

## 2019-02-12 DIAGNOSIS — J449 Chronic obstructive pulmonary disease, unspecified: Secondary | ICD-10-CM

## 2019-02-12 NOTE — Progress Notes (Signed)
SLEEP MEDICINE CLINIC   Provider:  Larey Seat, MD   Primary Care Physician:  Nuala Alpha, DO   Referring Provider: Nuala Alpha, DO   Chief Complaint  Patient presents with  . New Patient (Initial Visit)    pt with wife, rm 24. pt's wife states that he is very restless in sleep and he snores and has apneic episodes. she states that when he starts breathing again that he gasp for air. pt has never had a sleep study done. he averages about 4 hrs of sleep a night and doesnt wake up frequently. he states he is tired during the day.     HPI:  Preston Sullivan is a 53 y.o. male patient, seen here on 02-12-2019 in a referral from Dr. Garlan Fillers, DO  for a sleep apnea evaluation.   Chief complaint according to patient : "My wife has witnessed snoring , apneas. I am getting worse and tired in the day"   Preston Sullivan is a 53 year old right-handed African-American gentleman with a history of back pain, he was recently reassessed for the possible presence of COPD-emphysema and presented for his dyspnea to his primary care.  Thyroid was checked, a basic metabolic profile was obtained and the patient was urged to stop smoking.  He also had an echocardiogram ordered.  He does not have lower extremity swelling and he usually does not feel that it hurts him to take a deep breath.   He returned with a normal kidney function, normal CBC and differential normal fasting blood glucose, and normal thyroid levels.  Vitamin D was found to be very low which is common.  Heart rate was regular and blood pressure was lowish.  At this time he does have an active and productive Cough.   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  He usually goes to bed after midnight between 1 and 2 AM and he does not have trouble falling asleep.  He is asleep promptly as soon as he lies down.  And he  immediately starts snoring and gasping for air, having apneas.  The bedroom is described as cool, quiet and dark.  He tosses and turns mainly because of back problems.  He has been asked to sleep supine but he seems to prefer the lateral sleep position to fall asleep, he sleeps on 2-3 pillows. He suffers frequently from congestion, sinusitis and postnasal drip.  He also wakes up coughing with a productive cough.  He seems to have a chronic underlying bronchitis.  Since he is no longer on Chantix he has not been draining. Nocturia once or twice each night, he rises in the morning at 6 AM or 7 AM.  He averages 4 hours of sleep by his own estimate.   Sleep medical history : no HTN, no DM, but emphysema, chronic and recurrent bronchitis, cough. Apnea, snoring, no tonsillectomy.  Retrognathia , poor dentition.  Family sleep history: two daughters are snorers, son is not.   Social history:  Active smoker, late shift worker, married, 3 children:  61, 80 year old daughters, 21 is the age of his son. Coffee drinker- 1 mug in AM, drinking at work , too. No sodas, no iced tea.  ETOH- rarely .    Review of Systems: Out of a complete 14 system review, the patient complains of  only the following symptoms, and all other reviewed systems are negative.  Apnea and snoring witnessed, sleep deprivation.  " I can always go back to sleep, I am tired a lot"    Epworth Sleepiness score : 11/ 24  , Fatigue severity score: 61/ 63 points, depression score : N/ A -    Social History   Socioeconomic History  . Marital status: Married    Spouse name: Not on file  . Number of children: Not on file  . Years of education: Not on file  . Highest education level: Not on file  Occupational History  . Not on file  Social Needs  . Financial resource strain: Not on file  . Food insecurity:    Worry: Not on file    Inability: Not on file  . Transportation needs:    Medical: Not on file    Non-medical: Not on file    Tobacco Use  . Smoking status: Current Some Day Smoker    Packs/day: 0.25    Years: 42.00    Pack years: 10.50    Types: Cigarettes    Start date: 12/11/1975    Last attempt to quit: 12/30/2017    Years since quitting: 1.1  . Smokeless tobacco: Never Used  . Tobacco comment: 5 cigarettes per day as of 01/12/2019  Substance and Sexual Activity  . Alcohol use: Yes    Comment: soc  . Drug use: Yes    Types: Marijuana  . Sexual activity: Not on file  Lifestyle  . Physical activity:    Days per week: Not on file    Minutes per session: Not on file  . Stress: Not on file  Relationships  . Social connections:    Talks on phone: Not on file    Gets together: Not on file    Attends religious service: Not on file    Active member of club or organization: Not on file    Attends meetings of clubs or organizations: Not on file    Relationship status: Not on file  . Intimate partner violence:    Fear of current or ex partner: Not on file    Emotionally abused: Not on file    Physically abused: Not on file    Forced sexual activity: Not on file  Other Topics Concern  . Not on file  Social History Narrative  . Not on file    Family History  Problem Relation Age of Onset  . Heart attack Mother   . Diabetes Father   . Colon cancer Neg Hx   . Esophageal cancer Neg Hx   . Liver cancer Neg Hx   . Pancreatic cancer Neg Hx   . Rectal cancer Neg Hx   . Stomach cancer Neg Hx     Past Medical History:  Diagnosis Date  . Arthritis   . GERD (gastroesophageal reflux disease)   DDD- back pain- no surgery.  Past Surgical History:  Procedure Laterality Date  . APPENDECTOMY      Current Outpatient Medications  Medication Sig Dispense Refill  . Cholecalciferol (VITAMIN D-3) 125 MCG (5000 UT) TABS Take 2 PO qd x 3 months, then 1 PO qd long-term after that. 180 tablet 1  . etodolac (LODINE) 400 MG tablet Take 1 tablet (400 mg total) by mouth 2 (two) times daily as needed. 60 tablet 3  .  meloxicam (MOBIC) 15 MG tablet Take 0.5-1 tablets (7.5-15 mg total) by mouth daily as needed for pain.  30 tablet 6  . Multiple Vitamin (MULTIVITAMIN) tablet Take 1 tablet daily by mouth.    . nicotine (NICODERM CQ - DOSED IN MG/24 HOURS) 14 mg/24hr patch Place 1 patch (14 mg total) onto the skin daily. 30 patch 2  . omeprazole (PRILOSEC) 20 MG capsule Take 1 capsule (20 mg total) by mouth 2 (two) times daily before a meal. 60 capsule 2  . pravastatin (PRAVACHOL) 40 MG tablet Take 1 tablet (40 mg total) by mouth daily. 90 tablet 3   Current Facility-Administered Medications  Medication Dose Route Frequency Provider Last Rate Last Dose  . 0.9 %  sodium chloride infusion  500 mL Intravenous Once Armbruster, Carlota Raspberry, MD        Allergies as of 02/12/2019  . (No Known Allergies)    Vitals: BP 114/71   Pulse 66   Ht 5\' 7"  (1.702 m)   Wt 205 lb (93 kg)   BMI 32.11 kg/m  Last Weight:  Wt Readings from Last 1 Encounters:  02/12/19 205 lb (93 kg)   TMH:DQQI mass index is 32.11 kg/m.     Last Height:   Ht Readings from Last 1 Encounters:  02/12/19 5\' 7"  (1.702 m)    Physical exam:  General: The patient is awake, alert and appears not in acute distress. Head: Normocephalic, atraumatic. Neck is supple. Mallampati: 5 - soft palate not visible !!  neck circumference: 17. 25 . Nasal airflow congested - , Retrognathia is seen.  Cardiovascular:  Regular rate and rhythm, without  murmurs or carotid bruit, and without distended neck veins. Respiratory: Lungs are clear to auscultation. Skin:  Without evidence of edema, or rash Trunk: BMI is 32 . The patient's posture is erect  Neurologic exam : The patient is awake and alert, oriented to place and time.   Attention span & concentration ability appears normal.  Speech is fluent,  without dysarthria, dysphonia or aphasia.  Mood and affect are appropriate.  Cranial nerves: Pupils are equal and briskly reactive to light.  Right Funduscopic exam  without evidence of pallor or edema. 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  Extraocular movements in vertical and horizontal planes intact and without nystagmus. Visual fields : left perimetry restricted.  Hearing to finger rub intact.   Facial sensation intact to fine touch.  Facial motor strength is symmetric and tongue and uvula move midline. Shoulder shrug was symmetrical.   Motor exam: Normal tone, muscle bulk and symmetric strength in all extremities. Sensory:  Fine touch, pinprick and vibration were tested in all extremities. Proprioception tested in the upper extremities was normal. Coordination: Rapid alternating movements in the fingers/hands was normal. Finger-to-nose maneuver  normal without evidence of ataxia, dysmetria or tremor. Gait and station: Patient walks without assistive device. Strength within normal limits. Stance is stable and normal.   Deep tendon reflexes: in the upper extremities are symmetric and intact. Lower extremity reflexes: present, symmetric.   Assessment:  After physical and neurologic examination, review of laboratory studies,  Personal review of imaging studies, reports of other /same  Imaging studies, results of polysomnography and / or neurophysiology testing and pre-existing records as far as provided in visit., my assessment is:    1) Mr. Preston Sullivan presents with a history of witnessed apnea, witnessed snoring and the inability to sleep longer than 4 hours uninterrupted, sometimes unable to go back to sleep.  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  Anatomically he is predisposed due to micrognathia and a very small upper airway.  I would like for the patient to undergo either a split-night polysomnography or a home sleep test.  My main goal is to treat the likely present obstructive sleep apnea but I want to obtain reliable oxygenation  data as well.  I will order a home sleep test as well as a split-night study as it will ultimately be up to the insurance to permit me testing.  I reviewed the patient's recent test results, laboratories and his medication list.  He is currently not on any medications that I would consider REM sleep suppressant or likely to change the sleep architecture.   The patient was given sleep hygiene instructions and smoking cessation instructions.   The patient was advised of the nature of the diagnosed disorder , the treatment options and the  risks for general health and wellness arising from not treating the condition.   I spent more than 50 minutes of face to face time with the patient.  Greater than 50% of time was spent in counseling and coordination of care. We have discussed the diagnosis and differential and I answered the patient's questions.    Plan:  Treatment plan and additional workup :  HST and split night ordered.    Larey Seat, MD 04/16/5928, 24:46 AM  Certified in Neurology by ABPN Certified in [place] by [facility] [address] [place], [place] [address]

## 2019-02-16 ENCOUNTER — Ambulatory Visit: Payer: BLUE CROSS/BLUE SHIELD | Admitting: Physical Therapy

## 2019-02-17 ENCOUNTER — Telehealth: Payer: Self-pay | Admitting: Physical Therapy

## 2019-02-17 NOTE — Telephone Encounter (Signed)
Patient contacted regarding missed visit on 02/16/2019. He reports he was out of town and did not get back back in town. He was advised he needs to call to cancel. This is his third missed visit. His last visit was canceled. He was advised to call tomorrow if he can not make it. Schedule 1 visit at a time per policy.

## 2019-02-18 ENCOUNTER — Ambulatory Visit: Payer: BLUE CROSS/BLUE SHIELD | Admitting: Physical Therapy

## 2019-02-25 ENCOUNTER — Ambulatory Visit (INDEPENDENT_AMBULATORY_CARE_PROVIDER_SITE_OTHER): Payer: BLUE CROSS/BLUE SHIELD | Admitting: Neurology

## 2019-02-25 ENCOUNTER — Other Ambulatory Visit: Payer: Self-pay

## 2019-02-25 DIAGNOSIS — G4733 Obstructive sleep apnea (adult) (pediatric): Secondary | ICD-10-CM | POA: Diagnosis not present

## 2019-02-25 DIAGNOSIS — G4730 Sleep apnea, unspecified: Secondary | ICD-10-CM

## 2019-02-25 DIAGNOSIS — Z72820 Sleep deprivation: Secondary | ICD-10-CM

## 2019-02-25 DIAGNOSIS — J449 Chronic obstructive pulmonary disease, unspecified: Secondary | ICD-10-CM

## 2019-02-25 DIAGNOSIS — R0600 Dyspnea, unspecified: Secondary | ICD-10-CM

## 2019-02-25 DIAGNOSIS — R0609 Other forms of dyspnea: Secondary | ICD-10-CM

## 2019-02-25 DIAGNOSIS — I200 Unstable angina: Secondary | ICD-10-CM

## 2019-02-25 DIAGNOSIS — F17200 Nicotine dependence, unspecified, uncomplicated: Secondary | ICD-10-CM

## 2019-02-26 ENCOUNTER — Encounter: Payer: BLUE CROSS/BLUE SHIELD | Admitting: Physical Therapy

## 2019-02-27 NOTE — Procedures (Signed)
[place], [place] [address] NAME:  Preston Sullivan                                                                      DOB: 1965-12-19 MEDICAL RECORD No: [id number]                                                 DOS: 02/26/2019 REFERRING PHYSICIAN: Nuala Alpha, DO STUDY PERFORMED: [facility] on [facility] HISTORY: HPI:  Preston Sullivan is a 53 y.o. male patient, seen here on 02-12-2019 in a referral from Dr. Garlan Fillers, [place]. for a sleep apnea evaluation.   Chief complaint according to patient: "My wife has witnessed snoring, apneas. I am getting worse and tired in the day"    Preston Sullivan is a 53 year old right-handed African-American gentleman with a history of back pain, he was recently reassessed for the possible presence of COPD -emphysema and presented for his dyspnea to his primary care. Thyroid was checked, a basic metabolic profile was obtained and the patient was urged to stop smoking.  He also had an echocardiogram ordered.  He does not have lower extremity swelling; he usually does not feel that it hurts him to take a deep breath. He returned with a normal kidney function, normal CBC and differential normal fasting blood glucose, and normal thyroid levels. Vitamin D was found to be very low which is common.  Heart rate was regular, blood pressure was lowish. At this time he presents with an active and productive cough. Epworth Sleepiness score: 11/ 24 points, Fatigue severity score: 61/ 63 points, BMI: 32.2 kg/m2.   STUDY RESULTS:  Total Recording Time: 6 h 44 mins; Calculated Sleep Time: 4 h 26 mins. Total Apnea/Hypopnea Index (AHI): 36.3/h; RDI: 37.6 /h; REM AHI: 46.0 /h Average Oxygen Saturation:  97 %; Lowest Oxygen Desaturation: 93%  Total Time in Oxygen Saturation below 89 %:  minutes  Average Heart Rate: 71 bpm (between 48 and 109 bpm). IMPRESSION: Severe sleep apnea-  Unfortunately, I have trouble believing the oxygen data- no desaturation below 93%? Was this patient on oxygen at home during the  test period?    RECOMMENDATION: Auto CPAP for AHI of 36.3/h with REM sleep accentuation.  Settings of 6-16 cm water , 2 cm EPR and heated humidity, mask of choice.  ONO on CPAP.  I certify that I have reviewed the raw data recording prior to the issuance of this report in accordance with the standards of the American Academy of Sleep Medicine (AASM). Larey Seat, M.D.    02/27/2019   Medical Director of [facility] at [facility], accredited by the AASM. Diplomat of the ABPN and ABSM.

## 2019-02-27 NOTE — Addendum Note (Signed)
Addended by: Larey Seat on: 02/27/2019 10:21 AM   Modules accepted: Orders

## 2019-03-04 ENCOUNTER — Encounter: Payer: Self-pay | Admitting: Neurology

## 2019-03-04 NOTE — Progress Notes (Signed)
Dear Dr. Nuala Alpha,  We handel all DME / CPAP from our end- right here at [facility] ,  Thank you for your referrals. We appreciate your trust.   Larey Seat, MD

## 2019-03-05 ENCOUNTER — Telehealth: Payer: Self-pay | Admitting: Neurology

## 2019-03-05 NOTE — Telephone Encounter (Signed)
Our sleep lab technician called pt. Advised pt that Dr. Brett Fairy reviewed their sleep study results and found that pt has severe sleep apnea. Dr. Brett Fairy  recommends that pt auto CPAP. I reviewed PAP compliance expectations with the pt. Pt is agreeable to starting a CPAP. I advised pt that an order will be sent to a DME, [facility], and [facility] will call the pt within about one week after they file with the pt's insurance. [facility] will show the pt how to use the machine, fit for masks, and troubleshoot the CPAP if needed. A follow up appt was made for insurance purposes with on 05/11/2019. Pt verbalized understanding to arrive 15 minutes early and bring their CPAP. A letter with all of this information in it will be mailed to the pt as a reminder. I verified with the pt that the address we have on file is correct. Pt verbalized understanding of results. Pt had no questions at this time but was encouraged to call back if questions arise. I have sent the order to [facility] and have received confirmation that they have received the order.

## 2019-03-05 NOTE — Telephone Encounter (Signed)
-----   Message from Larey Seat, MD sent at 02/27/2019 10:21 AM EDT ----- IMPRESSION: Severe sleep apnea-  Unfortunately, I have trouble believing the oxygen data- no  desaturation below 93%? Was this patient on oxygen at home during the test period?  RECOMMENDATION: Auto CPAP for AHI of 36.3/h with REM sleep  accentuation.  Settings of 6-16 cm water , 2 cm EPR and heated humidity, mask of  choice.  ONO on CPAP.

## 2019-03-06 ENCOUNTER — Telehealth: Payer: Self-pay | Admitting: Physical Therapy

## 2019-03-06 NOTE — Telephone Encounter (Signed)
Contacted patient and left voicemail on 02/18/2019 regarding fourth no-show visit. Patient had been advised to call back if he felt he needed further treatment. The patient will be discharged.

## 2019-04-29 ENCOUNTER — Ambulatory Visit (INDEPENDENT_AMBULATORY_CARE_PROVIDER_SITE_OTHER): Payer: BLUE CROSS/BLUE SHIELD | Admitting: Family Medicine

## 2019-04-29 ENCOUNTER — Other Ambulatory Visit: Payer: Self-pay

## 2019-04-29 ENCOUNTER — Encounter: Payer: Self-pay | Admitting: Family Medicine

## 2019-04-29 VITALS — BP 116/72 | HR 74

## 2019-04-29 DIAGNOSIS — G8929 Other chronic pain: Secondary | ICD-10-CM | POA: Diagnosis not present

## 2019-04-29 DIAGNOSIS — E559 Vitamin D deficiency, unspecified: Secondary | ICD-10-CM | POA: Diagnosis not present

## 2019-04-29 DIAGNOSIS — F17200 Nicotine dependence, unspecified, uncomplicated: Secondary | ICD-10-CM

## 2019-04-29 DIAGNOSIS — M545 Low back pain, unspecified: Secondary | ICD-10-CM

## 2019-04-29 MED ORDER — NICOTINE POLACRILEX 2 MG MT GUM: 2.0000 mg | CHEWING_GUM | OROMUCOSAL | 0 refills | Status: AC | PRN

## 2019-04-29 MED ORDER — NICOTINE 21 MG/24HR TD PT24: 21.0000 mg | MEDICATED_PATCH | Freq: Every day | TRANSDERMAL | 0 refills | Status: AC

## 2019-04-29 NOTE — Progress Notes (Addendum)
Subjective: Chief Complaint  Patient presents with  . Follow-up     HPI: Preston Sullivan is a 53 y.o. presenting to clinic today to discuss the following:  Chronic Low Back Pain Patient did participate in PT and states it helped him very little. He states he has also been compliant with home exercises and he still has "good days and bad days". He does understand that his back pain will most likely never again be completely gone but does hope to get it to a level where he can enjoy hobbies, activities, and work. He has been out of work due to [employer]. He states his back pain is worse and has problems standing for long periods of time.  Tobacco Use Disorder Due to stress he has restarted smoking again but wants to quit.     ROS noted in HPI.   Past Medical, Surgical, Social, and Family History Reviewed & Updated per EMR.   Pertinent Historical Findings include:   Social History   Tobacco Use  Smoking Status Current Some Day Smoker  . Packs/day: 0.25  . Years: 42.00  . Pack years: 10.50  . Types: Cigarettes  . Start date: 12/11/1975  . Last attempt to quit: 12/30/2017  . Years since quitting: 1.3  Smokeless Tobacco Never Used  Tobacco Comment   5 cigarettes per day as of 01/12/2019   Objective: BP 116/72   Pulse 74   SpO2 98%  Vitals and nursing notes reviewed  Physical Exam Gen: Alert and Oriented x 3, NAD HEENT: Normocephalic, atraumatic CV: RRR, no murmurs, normal S1, S2 split Resp: CTAB, mild bibasilar wheezing, rales, or rhonchi, comfortable work of breathing Ext: no clubbing, cyanosis, or edema Skin: warm, dry, intact, no rashes  Results for orders placed or performed in visit on 04/29/19 (from the past 72 hour(s))  VITAMIN D 25 Hydroxy (Vit-D Deficiency, Fractures)     Status: Abnormal   Collection Time: 04/29/19 11:33 AM  Result Value Ref Range   Vit D, 25-Hydroxy 11.3 (L) 30.0 - 100.0 ng/mL    Comment: Vitamin D deficiency has been defined by the Institute  of Medicine and an Endocrine Society practice guideline as a level of serum 25-OH vitamin D less than 20 ng/mL (1,2). The Endocrine Society went on to further define vitamin D insufficiency as a level between 21 and 29 ng/mL (2). 1. IOM (Institute of Medicine). 2010. Dietary reference    intakes for calcium and D. [place]: The    [employer]. 2. Holick MF, Binkley [place], Bischoff-Ferrari HA, et al.    Evaluation, treatment, and prevention of vitamin D    deficiency: an Endocrine Society clinical practice    guideline. JCEM. 2011 Jul; 96(7):1911-30.     Assessment/Plan:  Backache Patient has still not been seen by an orthopedic provider. I am concerned that his back pain has progressed with no response to conservative measures. At this time I am still trying to exhaust all possible measures before recommending him being seen at a pain clinic. This has been a chronic issue and he has been compliant with each step of recommended treatment so far so I remain hopeful we can find something besides pain meds to keep his pain manageable. We discussed my dislike of opioids at length and he was agreeable to try everything to avoid taking them. - MRI of Lumbar spine; he has not had any imaging of his lower back in over 2 years however the previous Lumbar Spine  x-ray was normal. If insurance does not cover get repeat lumbar spine x-ray first. - After MRI consider referring to orthopedics/sports medicine to see if they would make any further recommendations.  TOBACCO USER Restarted in the past 2 weeks but wanting to quit. Having difficulties with cravings.  - 21mg  Nicotine patch as he states 14mg  did not help.  - 2mg  Nicotine gum to use as needed; instructions on proper use given. - Patient has tried Chantix but had nightmares and had to stop taking. Patient had tried Wellbutrin before as well but could not tolerate that either.  Low Vitamin D Repeat Vit D level still low. Going back  to Vit D 50k Units weekly for 8 weeks and then recheck in 8 weeks. May need longer treatment at 50k units.  PATIENT EDUCATION PROVIDED: See AVS    Diagnosis and plan along with any newly prescribed medication(s) were discussed in detail with this patient today. The patient verbalized understanding and agreed with the plan. Patient advised if symptoms worsen return to clinic or ER.   Health Maintainance:   Orders Placed This Encounter  Procedures  . VITAMIN D 25 Hydroxy (Vit-D Deficiency, Fractures)    Standing Status:   Future    Standing Expiration Date:   04/28/2020    Meds ordered this encounter  Medications  . nicotine (NICODERM CQ - DOSED IN MG/24 HOURS) 21 mg/24hr patch    Sig: Place 1 patch (21 mg total) onto the skin daily.    Dispense:  28 patch    Refill:  0     Tim Garlan Fillers, DO 04/29/2019, 11:00 AM PGY-2 [place]

## 2019-04-29 NOTE — Patient Instructions (Signed)
It was great to see you today! Thank you for letting me participate in your care!  Today, we discussed your continued low back pain. I am ordering an MRI of your low back to see if there is anything there the the x-ray may have missed. I will talk about the results once I have them.  For smoking cessation I have sent in a stronger patch and you can use the gum along with it. Please remember to use the gum properly.  Be well, Harolyn Rutherford, DO PGY-2, [facility]

## 2019-04-30 ENCOUNTER — Ambulatory Visit (HOSPITAL_COMMUNITY): Payer: BLUE CROSS/BLUE SHIELD

## 2019-04-30 LAB — VITAMIN D 25 HYDROXY (VIT D DEFICIENCY, FRACTURES): Vit D, 25-Hydroxy: 11.3 ng/mL — ABNORMAL LOW (ref 30.0–100.0)

## 2019-05-01 NOTE — Assessment & Plan Note (Signed)
Restarted in the past 2 weeks but wanting to quit. Having difficulties with cravings.  - 21mg  Nicotine patch as he states 14mg  did not help.  - 2mg  Nicotine gum to use as needed; instructions on proper use given. - Patient has tried Chantix but had nightmares and had to stop taking. Patient had tried Wellbutrin before as well but could not tolerate that either.

## 2019-05-01 NOTE — Assessment & Plan Note (Signed)
Patient has still not been seen by an orthopedic provider. I am concerned that his back pain has progressed with no response to conservative measures. At this time I am still trying to exhaust all possible measures before recommending him being seen at a pain clinic. This has been a chronic issue and he has been compliant with each step of recommended treatment so far so I remain hopeful we can find something besides pain meds to keep his pain manageable. We discussed my dislike of opioids at length and he was agreeable to try everything to avoid taking them. - MRI of Lumbar spine; he has not had any imaging of his lower back in over 2 years however the previous Lumbar Spine x-ray was normal. If insurance does not cover get repeat lumbar spine x-ray first. - After MRI consider referring to orthopedics/sports medicine to see if they would make any further recommendations.

## 2019-05-05 ENCOUNTER — Telehealth: Payer: Self-pay

## 2019-05-05 NOTE — Telephone Encounter (Signed)
Spoke with the patient and they have given verbal consent to file their insurance and to do a doxy.me visit. E-mail, mobile number and carrier have been confirmed and sent.   Text: [phone] [facility])

## 2019-05-07 ENCOUNTER — Telehealth: Payer: Self-pay | Admitting: *Deleted

## 2019-05-08 ENCOUNTER — Ambulatory Visit (HOSPITAL_COMMUNITY): Payer: BLUE CROSS/BLUE SHIELD

## 2019-05-11 ENCOUNTER — Encounter (INDEPENDENT_AMBULATORY_CARE_PROVIDER_SITE_OTHER): Payer: BLUE CROSS/BLUE SHIELD | Admitting: Family Medicine

## 2019-05-11 ENCOUNTER — Other Ambulatory Visit: Payer: Self-pay

## 2019-05-11 DIAGNOSIS — Z0289 Encounter for other administrative examinations: Secondary | ICD-10-CM

## 2019-05-11 DIAGNOSIS — G4733 Obstructive sleep apnea (adult) (pediatric): Secondary | ICD-10-CM

## 2019-05-11 DIAGNOSIS — J449 Chronic obstructive pulmonary disease, unspecified: Secondary | ICD-10-CM

## 2019-05-11 NOTE — Progress Notes (Signed)
I called patient during scheduled appointment.  He reports that he is driving and is not comfortable with continuing the telephone visit at this time.  He states that he will call the office to reschedule at a time more convenient for him.

## 2019-09-16 IMAGING — CR DG CHEST 2V
2 series · 2 of 2 positions shown · non-contrast
Comparison: 11/09/2016

CLINICAL DATA: Cough, right flank pain.

EXAM:
CHEST  2 VIEW

[w chest pa]
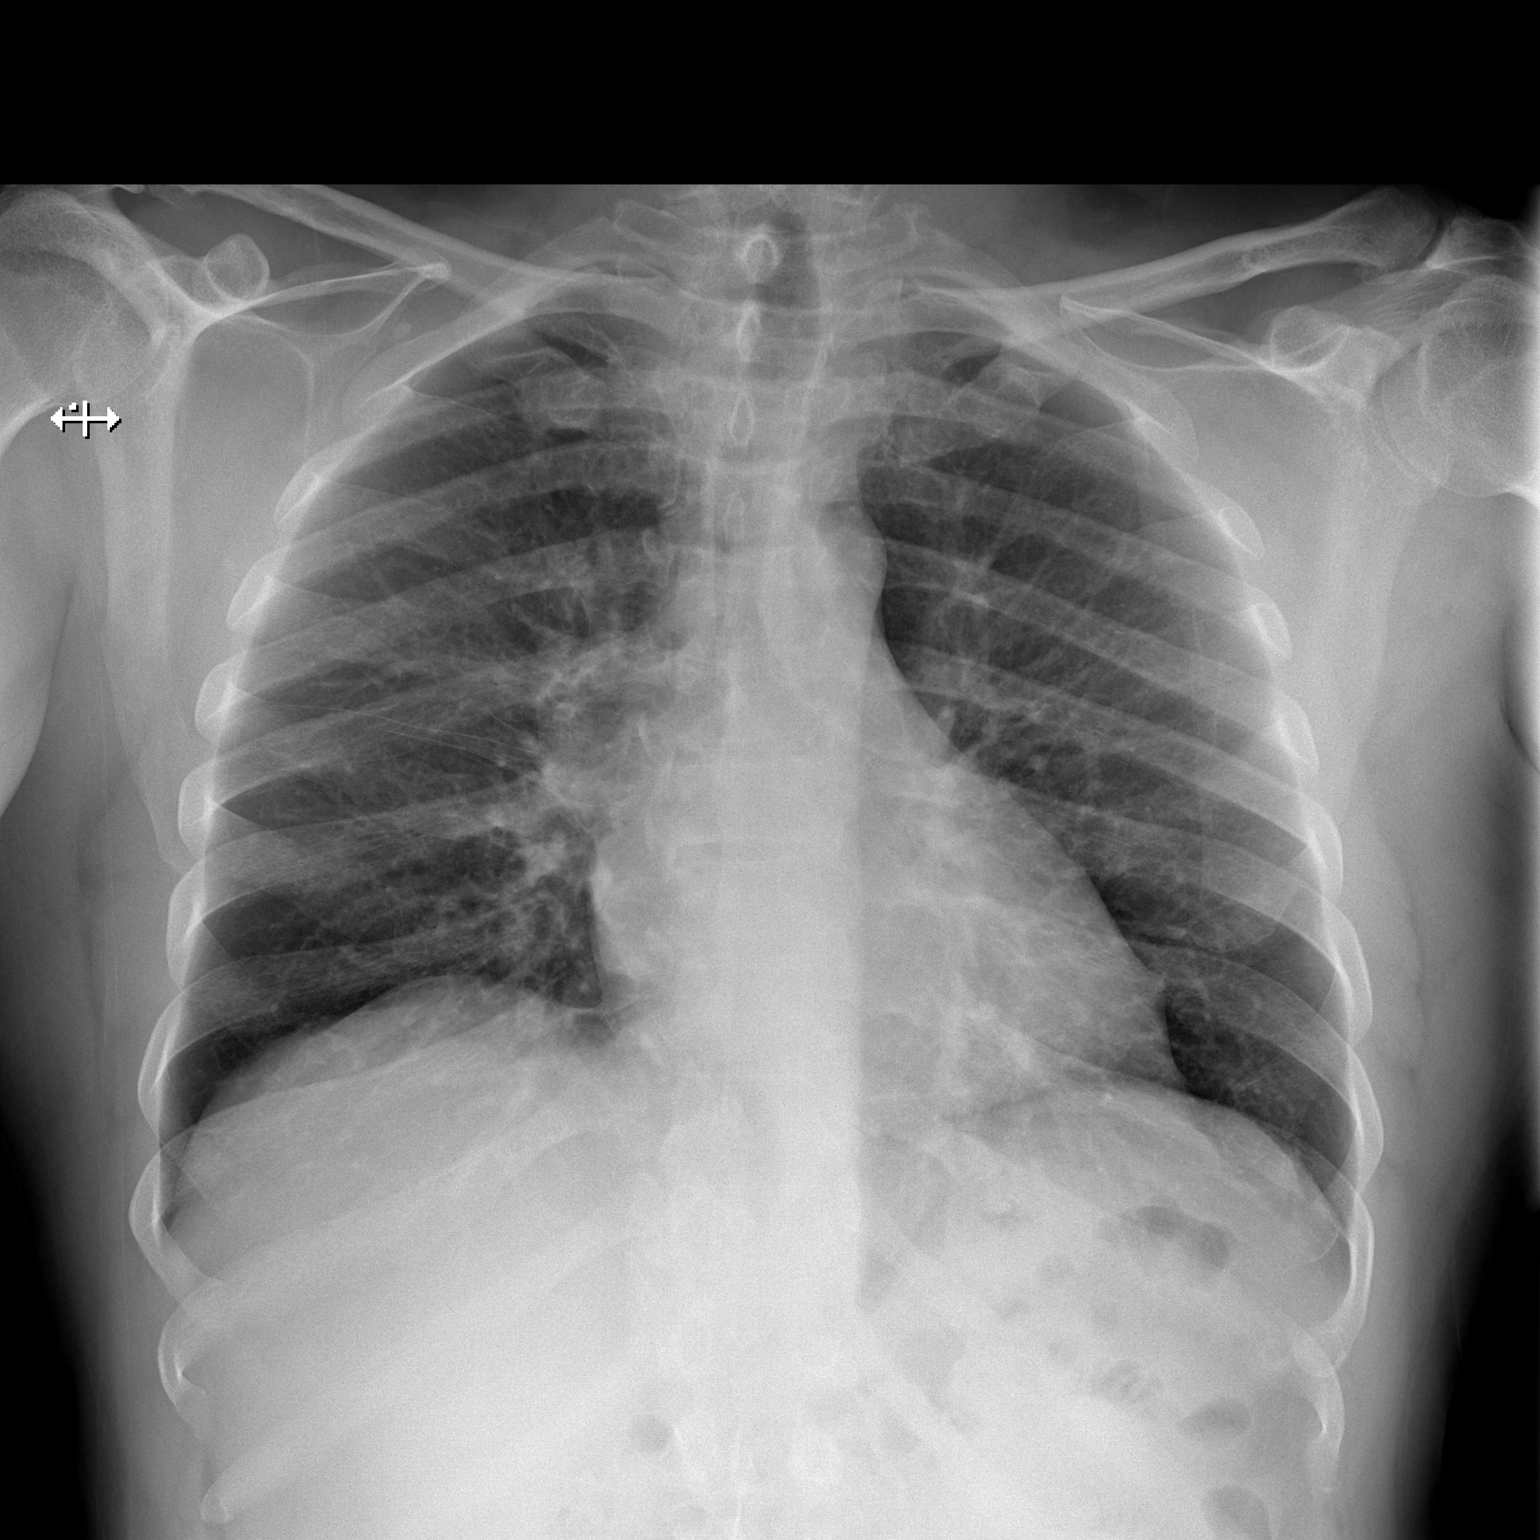

[w chest lat]
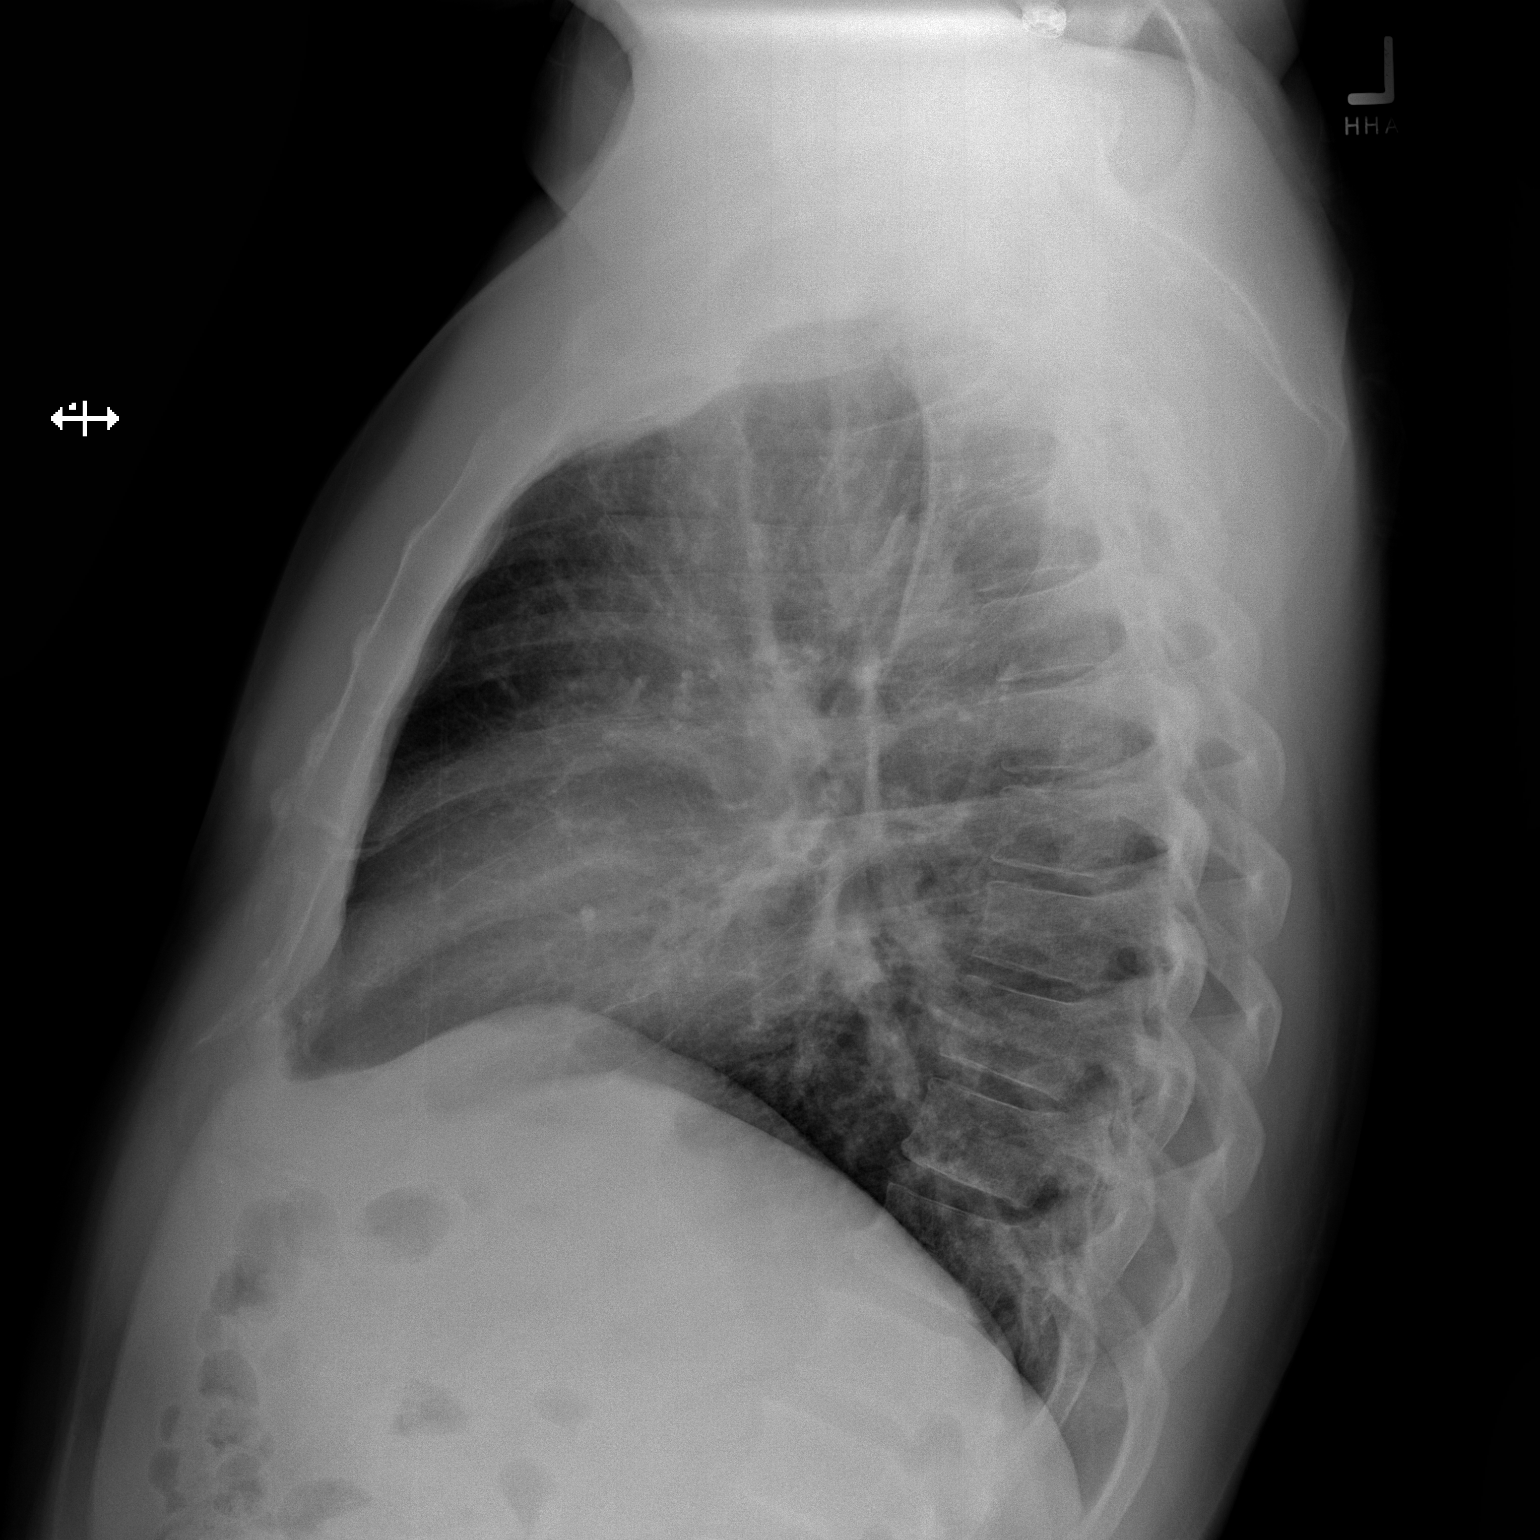

[2 of 2 positions shown; findings below may reference images not displayed]

FINDINGS: Mild peribronchial thickening. Heart and mediastinal contours are
within normal limits. No focal opacities or effusions. No acute bony
abnormality.
IMPRESSION: Mild bronchitic changes.

## 2019-09-16 IMAGING — CR DG LUMBAR SPINE COMPLETE 4+V
5 series · 5 of 5 positions shown · non-contrast
Comparison: 05/07/2009

CLINICAL DATA: Pain

EXAM:
LUMBAR SPINE - COMPLETE 4+ VIEW

[t lumbar spine ap]
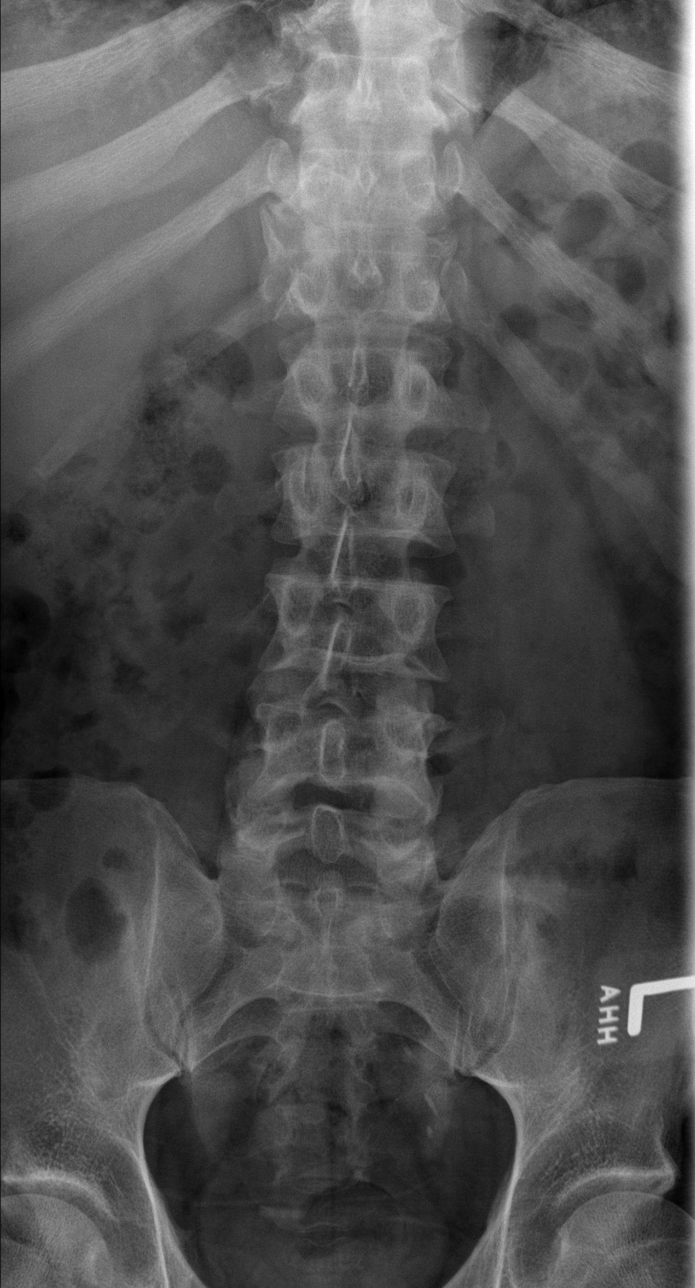

[t lumbar spine obl (1 of 2)]
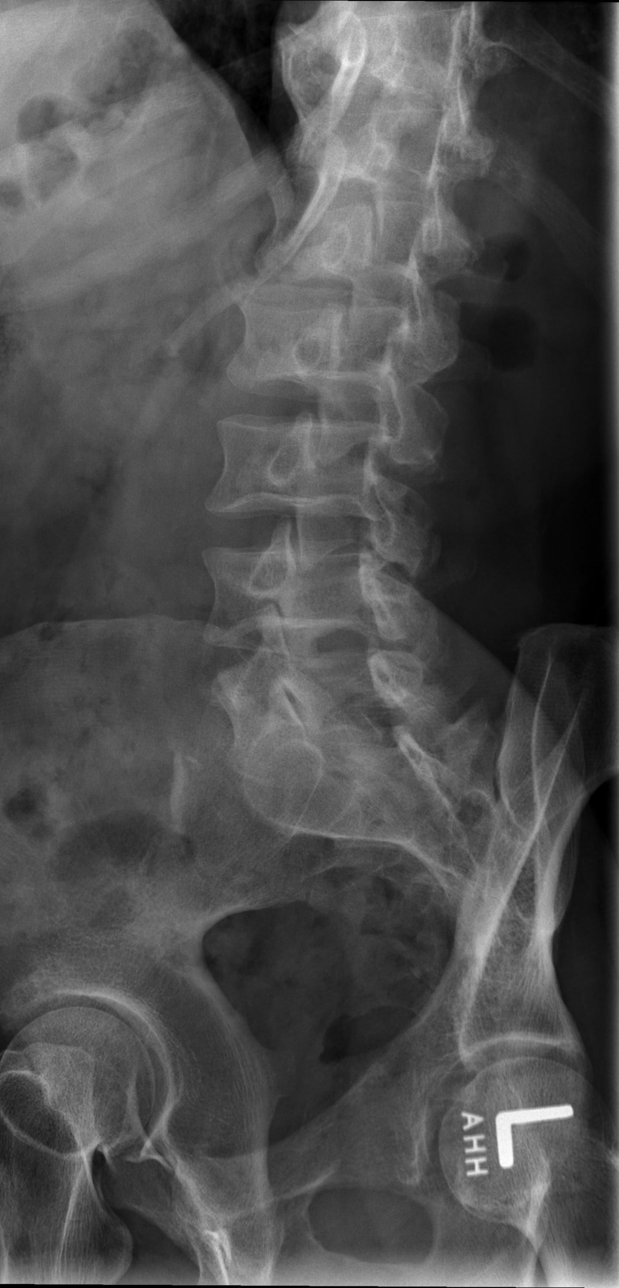

[t lumbar spine obl (2 of 2)]
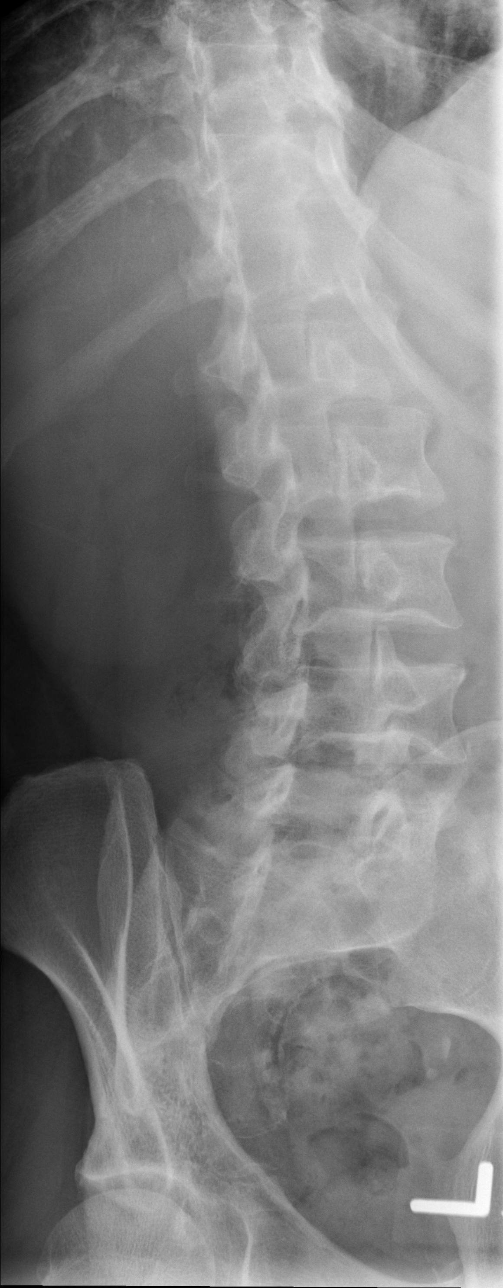

[t lumbar spine lat]
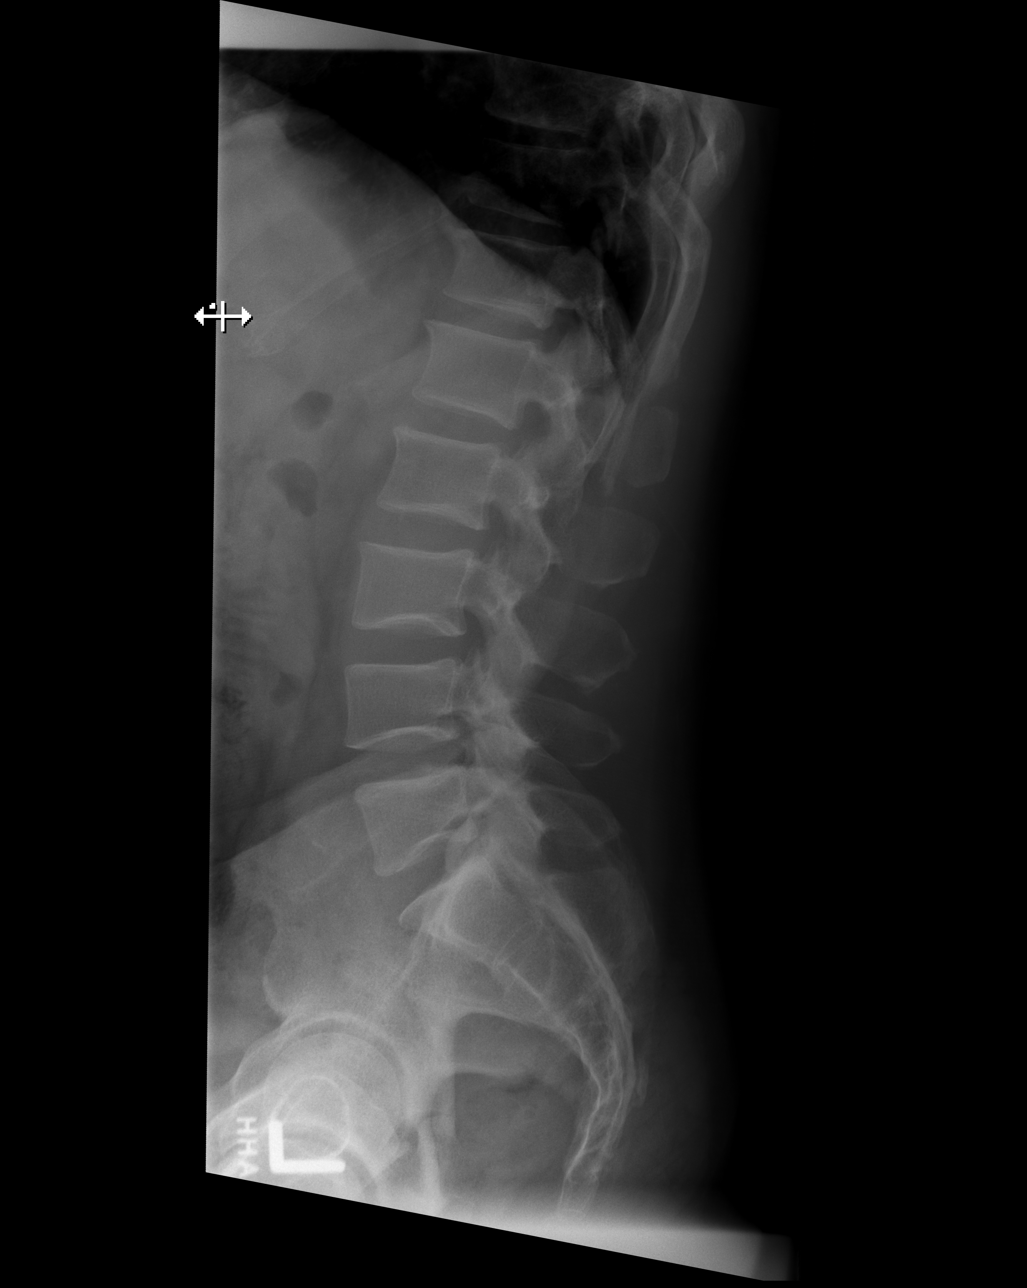

[t lumbar l-5 s-1 spot]
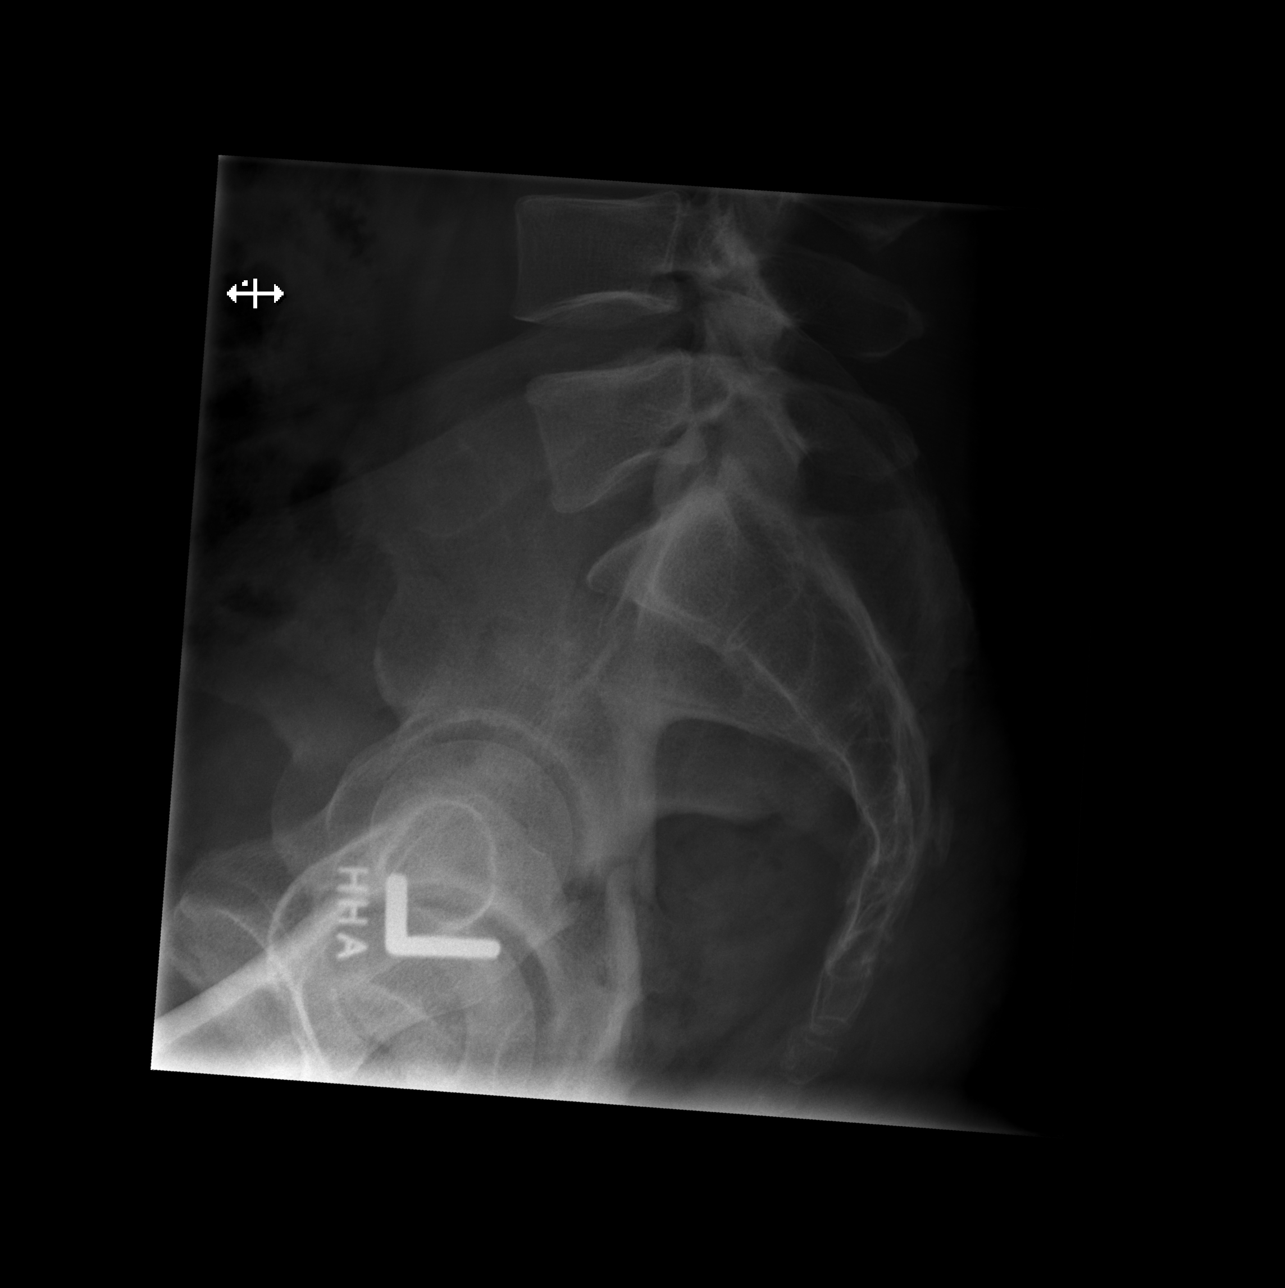

[5 of 5 positions shown; findings below may reference images not displayed]

FINDINGS: There is no evidence of lumbar spine fracture. Alignment is normal.
Intervertebral disc spaces are maintained.
IMPRESSION: Negative.

## 2019-12-30 ENCOUNTER — Other Ambulatory Visit: Payer: Self-pay

## 2019-12-30 ENCOUNTER — Ambulatory Visit (INDEPENDENT_AMBULATORY_CARE_PROVIDER_SITE_OTHER): Payer: 59 | Admitting: Family Medicine

## 2019-12-30 VITALS — BP 119/70 | HR 71 | Wt 202.2 lb

## 2019-12-30 DIAGNOSIS — E559 Vitamin D deficiency, unspecified: Secondary | ICD-10-CM

## 2019-12-30 DIAGNOSIS — M545 Low back pain, unspecified: Secondary | ICD-10-CM

## 2019-12-30 DIAGNOSIS — G8929 Other chronic pain: Secondary | ICD-10-CM

## 2019-12-30 MED ORDER — OMEPRAZOLE 20 MG PO CPDR: 20.0000 mg | DELAYED_RELEASE_CAPSULE | Freq: Two times a day (BID) | ORAL | 2 refills | Status: AC

## 2019-12-30 MED ORDER — VITAMIN D (ERGOCALCIFEROL) 1.25 MG (50000 UNIT) PO CAPS: 50000.0000 [IU] | ORAL_CAPSULE | ORAL | 0 refills | Status: AC

## 2019-12-30 NOTE — Patient Instructions (Signed)
It was great to see you today! Thank you for letting me participate in your care!  Today, we discussed further work up for your low back pain. I am proceeding with ordering an MRI for further evaluation to see if you may benefit from surgery. I have also referred you to a pain clinic.   I am repeating some labs to check your Vit D level and other electrolytes. If it is abnormal I will call you.  Be well, Harolyn Rutherford, DO PGY-3, [facility]

## 2019-12-30 NOTE — Progress Notes (Signed)
Subjective: Chief Complaint  Patient presents with  . Back Pain     HPI: Preston Sullivan is a 54 y.o. presenting to clinic today to discuss the following:  Chronic Low Back Pain Preston Sullivan is a 54y/o male presenting with long standing low back pain. He states his pain is non-radiating located in the lumbar region and most days gets to a 7/10. It is worse on standing, walking, and bending over. He has quit several jobs over the past year due to being unable to perform his duties because of the pain. He has no associated nausea, vomiting, diarrhea, or constipation. No loss of bowel or bladder function, no gait instability, no groin numbness. He has tried and failed home exercises and two rounds of formal PT.  Vit D Deficiency Continued low Vit D. Patient has been on 50,000 Units weekly for the past 8 weeks. Recheck today. No falls, trauma, no concern for any fractures today.  Health Maintenance: up to date     ROS noted in HPI.    Social History   Tobacco Use  Smoking Status Current Some Day Smoker  . Packs/day: 0.25  . Years: 42.00  . Pack years: 10.50  . Types: Cigarettes  . Start date: 12/11/1975  . Last attempt to quit: 12/30/2017  . Years since quitting: 2.0  Smokeless Tobacco Never Used  Tobacco Comment   5 cigarettes per day as of 01/12/2019   Objective: BP 119/70   Pulse 71   Wt 202 lb 3.2 oz (91.7 kg)   SpO2 100%   BMI 31.67 kg/m  Vitals and nursing notes reviewed  Physical Exam Gen: Alert and Oriented x 3, NAD CV: RRR, no murmurs, normal S1, S2 split Resp: CTAB, no wheezing, rales, or rhonchi, comfortable work of breathing MSK: Lumbar spine: - Inspection: no gross deformity or asymmetry, swelling or ecchymosis. No skin changes - Palpation: No TTP over the spinous processes, TTP along the lumbar paraspinal muscles, none at the SI joints b/l - ROM: limited active ROM of the lumbar spine in flexion and extension with pain - Strength: 5/5 strength of lower  extremity in L4-S1 nerve root distributions b/l - Neuro: sensation intact in the L4-S1 nerve root distribution b/l, 2+ L4 and S1 reflexes - Special testing: Negative straight leg raise Ext: no clubbing, cyanosis, or edema Neuro: No gross deficits Skin: warm, dry, intact, no rashes  Assessment/Plan:  Backache MRI of lumbar spine to see if he may benefit from any surgical intervention.  - Referral to pain clinic seeing he has failed conservative therapy. - Patient given information and referral to have an evaluation for disability   PATIENT EDUCATION PROVIDED: See AVS    Diagnosis and plan along with any newly prescribed medication(s) were discussed in detail with this patient today. The patient verbalized understanding and agreed with the plan. Patient advised if symptoms worsen return to clinic or ER.    Orders Placed This Encounter  Procedures  . Preston Lumbar Spine W Wo Contrast    Standing Status:   Future    Standing Expiration Date:   02/26/2021    Order Specific Question:   If indicated for the ordered procedure, I authorize the administration of contrast media per Radiology protocol    Answer:   Yes    Order Specific Question:   What is the patient's sedation requirement?    Answer:   Anti-anxiety    Order Specific Question:   Does the patient have a  pacemaker or implanted devices?    Answer:   No    Order Specific Question:   Radiology Contrast Protocol - do NOT remove file path    Answer:   [email or web address]    Order Specific Question:   Preferred imaging location?    Answer:   [facility] (table limit-500 lbs)  . VITAMIN D 25 Hydroxy (Vit-D Deficiency, Fractures)  . Basic Metabolic Panel  . Ambulatory referral to Pain Clinic    Referral Priority:   Routine    Referral Type:   Consultation    Referral Reason:   Specialty Services Required    Requested Specialty:   Pain Medicine    Number of Visits Requested:   1    Meds ordered this  encounter  Medications  . omeprazole (PRILOSEC) 20 MG capsule    Sig: Take 1 capsule (20 mg total) by mouth 2 (two) times daily before a meal.    Dispense:  60 capsule    Refill:  2  . Vitamin D, Ergocalciferol, (DRISDOL) 1.25 MG (50000 UNIT) CAPS capsule    Sig: Take 1 capsule (50,000 Units total) by mouth every 7 (seven) days.    Dispense:  8 capsule    Refill:  0     Harolyn Rutherford, DO 12/30/2019, 9:59 AM PGY-3 [place]

## 2019-12-31 LAB — VITAMIN D 25 HYDROXY (VIT D DEFICIENCY, FRACTURES): Vit D, 25-Hydroxy: 9.9 ng/mL — ABNORMAL LOW (ref 30.0–100.0)

## 2020-01-04 NOTE — Assessment & Plan Note (Signed)
MRI of lumbar spine to see if he may benefit from any surgical intervention.  - Referral to pain clinic seeing he has failed conservative therapy. - Patient given information and referral to have an evaluation for disability

## 2020-01-14 ENCOUNTER — Encounter: Payer: Self-pay | Admitting: Physical Medicine and Rehabilitation

## 2020-01-29 ENCOUNTER — Encounter: Payer: 59 | Admitting: Physical Medicine and Rehabilitation

## 2020-02-05 ENCOUNTER — Ambulatory Visit: Payer: 59 | Admitting: Physical Medicine and Rehabilitation

## 2020-02-10 ENCOUNTER — Encounter: Payer: 59 | Attending: Physical Medicine and Rehabilitation | Admitting: Physical Medicine and Rehabilitation

## 2020-04-28 ENCOUNTER — Encounter: Payer: 59 | Admitting: Family Medicine
